# Patient Record
Sex: Female | Born: 1985 | Race: Black or African American | Hispanic: No | Marital: Single | Smoking: Former smoker
Health system: Southern US, Community
[De-identification: ages and names within clinical notes are randomized; demographics above are authoritative.]

## PROBLEM LIST (undated history)

## (undated) DIAGNOSIS — R569 Unspecified convulsions: Secondary | ICD-10-CM

## (undated) DIAGNOSIS — O09299 Supervision of pregnancy with other poor reproductive or obstetric history, unspecified trimester: Secondary | ICD-10-CM

## (undated) DIAGNOSIS — Z973 Presence of spectacles and contact lenses: Secondary | ICD-10-CM

## (undated) DIAGNOSIS — Z309 Encounter for contraceptive management, unspecified: Secondary | ICD-10-CM

## (undated) HISTORY — DX: Unspecified convulsions: R56.9

## (undated) HISTORY — DX: Supervision of pregnancy with other poor reproductive or obstetric history, unspecified trimester: O09.299

## (undated) HISTORY — DX: Presence of spectacles and contact lenses: Z97.3

## (undated) HISTORY — DX: Encounter for contraceptive management, unspecified: Z30.9

---

## 2007-05-25 ENCOUNTER — Emergency Department (HOSPITAL_COMMUNITY): Admission: EM | Admit: 2007-05-25 | Discharge: 2007-05-25 | Payer: Self-pay | Admitting: Emergency Medicine

## 2007-05-29 ENCOUNTER — Inpatient Hospital Stay (HOSPITAL_COMMUNITY): Admission: AD | Admit: 2007-05-29 | Discharge: 2007-05-30 | Payer: Self-pay | Admitting: Gynecology

## 2007-11-22 HISTORY — PX: CERVICAL CERCLAGE: SHX1329

## 2007-12-30 ENCOUNTER — Inpatient Hospital Stay (HOSPITAL_COMMUNITY): Admission: AD | Admit: 2007-12-30 | Discharge: 2007-12-31 | Payer: Self-pay | Admitting: Obstetrics & Gynecology

## 2008-03-26 ENCOUNTER — Ambulatory Visit: Payer: Self-pay | Admitting: Family Medicine

## 2008-03-26 ENCOUNTER — Inpatient Hospital Stay (HOSPITAL_COMMUNITY): Admission: AD | Admit: 2008-03-26 | Discharge: 2008-03-28 | Payer: Self-pay | Admitting: Family Medicine

## 2008-03-29 ENCOUNTER — Inpatient Hospital Stay (HOSPITAL_COMMUNITY): Admission: AD | Admit: 2008-03-29 | Discharge: 2008-04-01 | Payer: Self-pay | Admitting: Obstetrics and Gynecology

## 2008-03-29 ENCOUNTER — Ambulatory Visit: Payer: Self-pay | Admitting: Obstetrics and Gynecology

## 2008-03-30 ENCOUNTER — Encounter: Payer: Self-pay | Admitting: Obstetrics and Gynecology

## 2009-01-12 ENCOUNTER — Emergency Department (HOSPITAL_COMMUNITY): Admission: EM | Admit: 2009-01-12 | Discharge: 2009-01-12 | Payer: Self-pay | Admitting: Emergency Medicine

## 2009-01-15 ENCOUNTER — Emergency Department (HOSPITAL_COMMUNITY): Admission: EM | Admit: 2009-01-15 | Discharge: 2009-01-15 | Payer: Self-pay | Admitting: Emergency Medicine

## 2010-02-08 ENCOUNTER — Inpatient Hospital Stay (HOSPITAL_COMMUNITY): Admission: AD | Admit: 2010-02-08 | Discharge: 2010-02-08 | Payer: Self-pay | Admitting: Obstetrics & Gynecology

## 2010-02-26 ENCOUNTER — Inpatient Hospital Stay (HOSPITAL_COMMUNITY): Admission: AD | Admit: 2010-02-26 | Discharge: 2010-02-26 | Payer: Self-pay | Admitting: Obstetrics and Gynecology

## 2010-03-15 ENCOUNTER — Inpatient Hospital Stay (HOSPITAL_COMMUNITY): Admission: AD | Admit: 2010-03-15 | Discharge: 2010-03-15 | Payer: Self-pay | Admitting: Obstetrics & Gynecology

## 2010-04-29 ENCOUNTER — Encounter: Payer: Self-pay | Admitting: Family Medicine

## 2010-04-29 ENCOUNTER — Inpatient Hospital Stay (HOSPITAL_COMMUNITY): Admission: AD | Admit: 2010-04-29 | Discharge: 2010-05-02 | Payer: Self-pay | Admitting: Obstetrics & Gynecology

## 2010-04-29 ENCOUNTER — Ambulatory Visit: Payer: Self-pay | Admitting: Nurse Practitioner

## 2010-05-01 ENCOUNTER — Encounter: Payer: Self-pay | Admitting: Obstetrics & Gynecology

## 2010-05-19 ENCOUNTER — Ambulatory Visit: Payer: Self-pay | Admitting: Obstetrics and Gynecology

## 2010-05-19 LAB — CONVERTED CEMR LAB
Anticardiolipin IgA: 0 (ref <22)
Anticardiolipin IgG: 5 (ref <23)
Protein S Activity: 85 % (ref 69–129)

## 2010-05-21 DEATH — deceased

## 2010-05-26 ENCOUNTER — Ambulatory Visit (HOSPITAL_COMMUNITY): Admission: RE | Admit: 2010-05-26 | Discharge: 2010-05-26 | Payer: Self-pay | Admitting: Family Medicine

## 2010-06-07 ENCOUNTER — Ambulatory Visit: Payer: Self-pay | Admitting: Obstetrics & Gynecology

## 2011-02-07 LAB — URINALYSIS, ROUTINE W REFLEX MICROSCOPIC
Glucose, UA: NEGATIVE mg/dL
Protein, ur: NEGATIVE mg/dL
Specific Gravity, Urine: 1.01 (ref 1.005–1.030)
Urobilinogen, UA: 0.2 mg/dL (ref 0.0–1.0)

## 2011-02-07 LAB — CBC
HCT: 36 % (ref 36.0–46.0)
HCT: 37.8 % (ref 36.0–46.0)
HCT: 37.9 % (ref 36.0–46.0)
Hemoglobin: 12.4 g/dL (ref 12.0–15.0)
Hemoglobin: 12.7 g/dL (ref 12.0–15.0)
Hemoglobin: 12.9 g/dL (ref 12.0–15.0)
MCHC: 33.5 g/dL (ref 30.0–36.0)
MCHC: 34 g/dL (ref 30.0–36.0)
MCV: 90.3 fL (ref 78.0–100.0)
MCV: 90.5 fL (ref 78.0–100.0)
Platelets: 219 10*3/uL (ref 150–400)
RBC: 3.99 MIL/uL (ref 3.87–5.11)
RBC: 4.19 MIL/uL (ref 3.87–5.11)
RDW: 13.4 % (ref 11.5–15.5)
WBC: 16.5 10*3/uL — ABNORMAL HIGH (ref 4.0–10.5)
WBC: 22.8 10*3/uL — ABNORMAL HIGH (ref 4.0–10.5)

## 2011-02-07 LAB — STREP B DNA PROBE

## 2011-02-07 LAB — URINE MICROSCOPIC-ADD ON

## 2011-02-07 LAB — WET PREP, GENITAL: Trich, Wet Prep: NONE SEEN

## 2011-02-07 LAB — URINE CULTURE: Colony Count: >=100000

## 2011-02-08 LAB — URINALYSIS, ROUTINE W REFLEX MICROSCOPIC
Bilirubin Urine: NEGATIVE
Glucose, UA: NEGATIVE mg/dL
Nitrite: NEGATIVE
Protein, ur: NEGATIVE mg/dL
Specific Gravity, Urine: 1.015 (ref 1.005–1.030)
Urobilinogen, UA: 0.2 mg/dL (ref 0.0–1.0)
pH: 7 (ref 5.0–8.0)

## 2011-02-08 LAB — URINE MICROSCOPIC-ADD ON

## 2011-02-09 LAB — URINALYSIS, ROUTINE W REFLEX MICROSCOPIC: Hgb urine dipstick: NEGATIVE

## 2011-02-09 LAB — WET PREP, GENITAL: Trich, Wet Prep: NONE SEEN

## 2011-02-09 LAB — GC/CHLAMYDIA PROBE AMP, GENITAL: Chlamydia, DNA Probe: NEGATIVE

## 2011-03-08 LAB — URINALYSIS, ROUTINE W REFLEX MICROSCOPIC
Bilirubin Urine: NEGATIVE
Hgb urine dipstick: NEGATIVE
Ketones, ur: >80 mg/dL — AB
Protein, ur: >300 mg/dL — AB
Specific Gravity, Urine: 1.036 — ABNORMAL HIGH (ref 1.005–1.030)
Urobilinogen, UA: 0.2 mg/dL (ref 0.0–1.0)

## 2011-03-08 LAB — COMPREHENSIVE METABOLIC PANEL
ALT: 35 U/L (ref 0–35)
Alkaline Phosphatase: 44 U/L (ref 39–117)
BUN: 10 mg/dL (ref 6–23)
CO2: 22 mEq/L (ref 19–32)
GFR calc non Af Amer: >60 mL/min (ref >60)
Glucose, Bld: 75 mg/dL (ref 70–99)
Potassium: 3.1 mEq/L — ABNORMAL LOW (ref 3.5–5.1)
Sodium: 130 mEq/L — ABNORMAL LOW (ref 135–145)

## 2011-03-08 LAB — DIFFERENTIAL
Basophils Absolute: 0 10*3/uL (ref 0.0–0.1)
Basophils Absolute: 0.1 10*3/uL (ref 0.0–0.1)
Eosinophils Absolute: 0 10*3/uL (ref 0.0–0.7)
Eosinophils Relative: 0 % (ref 0–5)
Lymphocytes Relative: 26 % (ref 12–46)
Lymphs Abs: 1.4 10*3/uL (ref 0.7–4.0)
Monocytes Absolute: 1.1 10*3/uL — ABNORMAL HIGH (ref 0.1–1.0)
Monocytes Relative: 32 % — ABNORMAL HIGH (ref 3–12)
Neutro Abs: 1.1 10*3/uL — ABNORMAL LOW (ref 1.7–7.7)

## 2011-03-08 LAB — BASIC METABOLIC PANEL
BUN: 7 mg/dL (ref 6–23)
CO2: 24 mEq/L (ref 19–32)
GFR calc Af Amer: >60 mL/min (ref >60)
GFR calc non Af Amer: >60 mL/min (ref >60)
Potassium: 3.9 mEq/L (ref 3.5–5.1)
Sodium: 136 mEq/L (ref 135–145)

## 2011-03-08 LAB — LIPASE, BLOOD: Lipase: 18 U/L (ref 11–59)

## 2011-03-08 LAB — CBC
HCT: 43.7 % (ref 36.0–46.0)
HCT: 46.9 % — ABNORMAL HIGH (ref 36.0–46.0)
Hemoglobin: 15.7 g/dL — ABNORMAL HIGH (ref 12.0–15.0)
MCHC: 33.4 g/dL (ref 30.0–36.0)
Platelets: 207 10*3/uL (ref 150–400)
RBC: 5 MIL/uL (ref 3.87–5.11)
RBC: 5.38 MIL/uL — ABNORMAL HIGH (ref 3.87–5.11)
WBC: 2.7 10*3/uL — ABNORMAL LOW (ref 4.0–10.5)

## 2011-03-08 LAB — URINE MICROSCOPIC-ADD ON

## 2011-03-08 LAB — D-DIMER, QUANTITATIVE: D-Dimer, Quant: 0.33 ug/mL-FEU (ref 0.00–0.48)

## 2011-04-05 NOTE — Op Note (Signed)
Katie Douglas, HOOTS              ACCOUNT NO.:  [id number]   MEDICAL RECORD NO.:  [id number]          PATIENT TYPE:  INP   LOCATION:  9149                          FACILITY:  [facility]   PHYSICIAN:  Tanya S. Shawnie Pons, M.D.   DATE OF BIRTH:  Apr 22, 1986   DATE OF PROCEDURE:  DATE OF DISCHARGE:                               OPERATIVE REPORT   PREOPERATIVE DIAGNOSIS:  Incompetent cervix.   POSTOPERATIVE DIAGNOSIS:  Incompetent cervix.   PROCEDURE:  Rescue cerclage McDonald tie with a Mersilene band.   SURGEON:  Shelbie Proctor. Shawnie Pons, MD   ANESTHESIA:  Spinal.   FINDINGS:  Cervix 0.5 to 1 cm opened with bulging membranes at the  external os.   SPECIMENS:  None.   ESTIMATED BLOOD LOSS:  Minimal.   COMPLICATIONS:  None immediately known.   REASON FOR PROCEDURE:  The patient is a 25 year old gravida 2, para 0-0-  1-0, who had a TAB at 10 weeks approximately 1 year.  She reported for a  new OB visit at the [facility] department on the day prior to the procedure  and on P/V was noted to have an open cervical os.  She underwent  sonography and admission on the day prior to procedure with cultures  being obtained.  The patient's cultures were all negative except she did  have a yeast infection and she had a history of a group B strep positive  urine culture in February that was previously treated.  The patient was  treated for yeast and was posted for cerclage.  She was noted to have  few contractions overnight that she was not feeling.   PROCEDURE:  The patient was taken to the OR, where spinal anesthesia was  administered and this was felt to be adequate.  She was prepped and  draped in usual sterile fashion.  A weighted speculum was placed inside  the vagina and Senn retractors were used to visualize the cervix.  The  previously stated membranes were noted at the office.  A Foley catheter  was inserted into the cervix and the balloon inflated with a continuous  advancement of the Foley balloon to  push the membranes back.  This  remained in place while the cerclage was sewn in.  A Mersilene band was  used to up from 12 to approximately 10/10 to 8/8 to 5/5 to 2/2 to 12.  This was tied down over the Foley catheter and then the Foley was  removed.  On exam post procedure, the cervix fell closed and knot was  tied at 12  o'clock and the edge of suture can be felt posteriorly at the time of  the procedure, so this was not then pulled through this actually could  be felt there.  The cervix was cinched tightly closed.  All instrument  and lap counts were correct x2.  The patient was awakened and taken to  recovery room in stable condition.      Shelbie Proctor. Shawnie Pons, M.D.  Electronically Signed     TSP/MEDQ  D:  03/27/2008  T:  03/28/2008  Job:  [id number]

## 2011-04-05 NOTE — Discharge Summary (Signed)
Katie Douglas, Katie Douglas              ACCOUNT NO.:  [id number]   MEDICAL RECORD NO.:  [id number]          PATIENT TYPE:  INP   LOCATION:  9149                          FACILITY:  [facility]   PHYSICIAN:  Tanya S. Shawnie Pons, M.D.   DATE OF BIRTH:  [birth year]   DATE OF ADMISSION:  03/26/2008  DATE OF DISCHARGE:  03/28/2008                               DISCHARGE SUMMARY   FINAL DIAGNOSES:  1. Intrauterine pregnancy at 19-3/7 weeks.  2. Premature dilation versus incompetent cervix.   PROCEDURES:  Bedrest 48 hours of Indocin and cervical cerclage.   PERTINENT LABORATORIES:  Rubella status which was immune, hepatitis B  negative, gonorrhea and chlamydia negative.  Rapid HIV nonreactive.  Urine drug screen positive for marijuana.  RPR nonreactive.  Urinalysis  that shows large leukocytes.  Blood type O positive.  Antibody screen  negative.  Wet prep that shows many WBCs.  Urine culture is pending at  time of dictation.   REASON FOR PROCEDURE:  Please see H&P on the chart.  The patient is a  gravida 2, para 0-0-1-0, who is admitted for premature dilation of the  cervix.  She was seen on the day of admission at the health department  and noted to have membranes at the os and the cervix was approximately  0.5 cm dilated.  She was subsequently staying here.   HOSPITAL COURSE:  The patient was admitted to this hospital after  cultures were obtained in MICU and ultrasound was done.  Ultrasound  failed to definitively show dilated cervix, but membranes were  visualized at the os and the cervix was opened approximately 0.5 cm.  The patient was placed on bedrest and monitored for 24 hours.  She was  noted to not have any signs of infection or contractions, and so she was  taken for cervical cerclage.  She underwent this procedure under spinal  anesthesia without significant difficulty.  Foley catheter had to be  used to move the membranes of the leg so the cerclage could be  performed.  Please see the  operative report dictation for full details.  Postoperatively, the patient was transferred back to the floor.  She was  monitored for 24 hours for contractions and fever of which she had none.  Her activity was allowed to be increased.  She was up to the bathroom  without difficulty.  She was started on Indocin which she will continue  for 72 hours.  Given how stable she was postoperatively, it was felt she  was stable for discharge.   DISCHARGE DISPOSITION:   CONDITION:  The patient was discharged home in good condition.   DISCHARGE MEDICATIONS:  Prenatal vitamins one p.o. daily.  Indocin 50 mg one p.o. q.6 h. x48 hours.   FOLLOW-UP:  Follow-up will be in [facility] on Thursday, Apr 03, 2008.  [name] will call her with this appointment.   ADDITIONAL INSTRUCTIONS:  The patient was instructed to return with  bleeding, pain, signs and symptoms of infection.      Shelbie Proctor. Shawnie Pons, M.D.  Electronically Signed  TSP/MEDQ  D:  03/28/2008  T:  03/28/2008  Job:  [id number]

## 2011-04-08 NOTE — Discharge Summary (Signed)
NAMESHAHED, YEOMAN              ACCOUNT NO.:  [id number]   MEDICAL RECORD NO.:  [id number]          PATIENT TYPE:  INP   LOCATION:                                FACILITY:  [facility]   PHYSICIAN:  Phil D. Okey Dupre, M.D.     DATE OF BIRTH:  04/10/1986   DATE OF ADMISSION:  DATE OF DISCHARGE:                               DISCHARGE SUMMARY   The extremely immature 20-week infant was delivered after the patient  had premature rupture of membranes and developed a chorioamnionitis.  She was given an epidural for pain and was placed on ampicillin and  clindamycin.  The 20-week fetus was delivered with a 1 Apgar and quickly  expired.  The mother was given Cytotec postpartum to facilitate delivery  of the placenta and was delivered spontaneously at 1500 hours.  The  weight of the fetus and the pathological findings were not apparent on  the chart.   DIAGNOSIS:  Preterm delivery of extremely immature infant.      Phil D. Okey Dupre, M.D.  Electronically Signed     PDR/MEDQ  D:  04/20/2008  T:  04/21/2008  Job:  [id number]

## 2011-08-12 LAB — BASIC METABOLIC PANEL
CO2: 24
Calcium: 9.6
Glucose, Bld: 93
Potassium: 3.2 — ABNORMAL LOW
Sodium: 137

## 2011-08-12 LAB — CBC
HCT: 40.2
Hemoglobin: 13.8
MCHC: 34.3
MCV: 88.3
RDW: 12

## 2011-08-12 LAB — URINALYSIS, ROUTINE W REFLEX MICROSCOPIC
Hgb urine dipstick: NEGATIVE
Specific Gravity, Urine: >1.03 — ABNORMAL HIGH
Urobilinogen, UA: 0.2

## 2011-08-12 LAB — URINE MICROSCOPIC-ADD ON

## 2011-08-12 LAB — URINE CULTURE

## 2011-09-06 LAB — URINE MICROSCOPIC-ADD ON

## 2011-09-06 LAB — COMPREHENSIVE METABOLIC PANEL
Alkaline Phosphatase: 47
BUN: 15
Chloride: 94 — ABNORMAL LOW
GFR calc non Af Amer: >60
Glucose, Bld: 85
Potassium: 2.9 — ABNORMAL LOW
Total Bilirubin: 2.3 — ABNORMAL HIGH
Total Protein: 7.1

## 2011-09-06 LAB — WET PREP, GENITAL
Clue Cells Wet Prep HPF POC: NONE SEEN
Trich, Wet Prep: NONE SEEN

## 2011-09-06 LAB — GC/CHLAMYDIA PROBE AMP, GENITAL
Chlamydia, DNA Probe: NEGATIVE
GC Probe Amp, Genital: NEGATIVE

## 2011-09-06 LAB — CBC
HCT: 47.8 — ABNORMAL HIGH
Hemoglobin: 16 — ABNORMAL HIGH
MCHC: 33.6
MCV: 85.6
Platelets: 310
RBC: 5.58 — ABNORMAL HIGH
RDW: 12.7
WBC: 10.1

## 2011-09-06 LAB — URINALYSIS, ROUTINE W REFLEX MICROSCOPIC
Glucose, UA: NEGATIVE
Hgb urine dipstick: NEGATIVE
Ketones, ur: >80 — AB
Nitrite: NEGATIVE
Nitrite: NEGATIVE
Protein, ur: 30 — AB
Specific Gravity, Urine: 1.028
Specific Gravity, Urine: >1.03 — ABNORMAL HIGH
Urobilinogen, UA: 1
Urobilinogen, UA: 1
pH: 6

## 2011-09-06 LAB — PREGNANCY, URINE: Preg Test, Ur: POSITIVE

## 2013-11-21 DIAGNOSIS — R569 Unspecified convulsions: Secondary | ICD-10-CM

## 2013-11-21 HISTORY — DX: Unspecified convulsions: R56.9

## 2016-04-25 ENCOUNTER — Emergency Department (HOSPITAL_COMMUNITY)
Admission: EM | Admit: 2016-04-25 | Discharge: 2016-04-25 | Disposition: A | Discharge status: Home / Self Care | Payer: No Typology Code available for payment source | Attending: Emergency Medicine | Admitting: Emergency Medicine

## 2016-04-25 ENCOUNTER — Encounter (HOSPITAL_COMMUNITY): Payer: Self-pay | Admitting: *Deleted

## 2016-04-25 ENCOUNTER — Emergency Department (HOSPITAL_COMMUNITY): Payer: No Typology Code available for payment source

## 2016-04-25 DIAGNOSIS — S4992XA Unspecified injury of left shoulder and upper arm, initial encounter: Secondary | ICD-10-CM | POA: Insufficient documentation

## 2016-04-25 DIAGNOSIS — Y9389 Activity, other specified: Secondary | ICD-10-CM | POA: Insufficient documentation

## 2016-04-25 DIAGNOSIS — Z87891 Personal history of nicotine dependence: Secondary | ICD-10-CM | POA: Insufficient documentation

## 2016-04-25 DIAGNOSIS — Y998 Other external cause status: Secondary | ICD-10-CM | POA: Insufficient documentation

## 2016-04-25 DIAGNOSIS — S6991XA Unspecified injury of right wrist, hand and finger(s), initial encounter: Secondary | ICD-10-CM | POA: Insufficient documentation

## 2016-04-25 DIAGNOSIS — S8991XA Unspecified injury of right lower leg, initial encounter: Secondary | ICD-10-CM | POA: Insufficient documentation

## 2016-04-25 DIAGNOSIS — S4991XA Unspecified injury of right shoulder and upper arm, initial encounter: Secondary | ICD-10-CM | POA: Insufficient documentation

## 2016-04-25 DIAGNOSIS — S6992XA Unspecified injury of left wrist, hand and finger(s), initial encounter: Secondary | ICD-10-CM | POA: Insufficient documentation

## 2016-04-25 DIAGNOSIS — Z79899 Other long term (current) drug therapy: Secondary | ICD-10-CM | POA: Insufficient documentation

## 2016-04-25 DIAGNOSIS — S199XXA Unspecified injury of neck, initial encounter: Secondary | ICD-10-CM | POA: Insufficient documentation

## 2016-04-25 DIAGNOSIS — S9032XA Contusion of left foot, initial encounter: Secondary | ICD-10-CM | POA: Insufficient documentation

## 2016-04-25 DIAGNOSIS — Y92410 Unspecified street and highway as the place of occurrence of the external cause: Secondary | ICD-10-CM | POA: Insufficient documentation

## 2016-04-25 DIAGNOSIS — T148XXA Other injury of unspecified body region, initial encounter: Secondary | ICD-10-CM

## 2016-04-25 DIAGNOSIS — T148 Other injury of unspecified body region: Secondary | ICD-10-CM | POA: Insufficient documentation

## 2016-04-25 MED ORDER — NAPROXEN 250 MG PO TABS
500.0000 mg | ORAL_TABLET | Freq: Once | ORAL | Status: AC
  Administered 2016-04-25: 500 mg via ORAL
  Filled 2016-04-25: qty 2

## 2016-04-25 MED ORDER — METHOCARBAMOL 500 MG PO TABS: 1000.0000 mg | ORAL_TABLET | Freq: Four times a day (QID) | ORAL | Status: DC

## 2016-04-25 MED ORDER — NAPROXEN 500 MG PO TABS: 500.0000 mg | ORAL_TABLET | Freq: Two times a day (BID) | ORAL | Status: DC
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
## 2016-04-25 NOTE — ED Notes (Signed)
Declined W/C at D/C and was escorted to lobby by RN. 
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
## 2016-08-26 ENCOUNTER — Emergency Department (HOSPITAL_COMMUNITY)
Admission: EM | Admit: 2016-08-26 | Discharge: 2016-08-27 | Disposition: A | Discharge status: Home / Self Care | Payer: BLUE CROSS/BLUE SHIELD | Attending: Emergency Medicine | Admitting: Emergency Medicine

## 2016-08-26 ENCOUNTER — Encounter (HOSPITAL_COMMUNITY): Payer: Self-pay | Admitting: Nurse Practitioner

## 2016-08-26 DIAGNOSIS — K148 Other diseases of tongue: Secondary | ICD-10-CM | POA: Diagnosis not present

## 2016-08-26 DIAGNOSIS — Z87891 Personal history of nicotine dependence: Secondary | ICD-10-CM | POA: Insufficient documentation

## 2016-08-26 DIAGNOSIS — R569 Unspecified convulsions: Secondary | ICD-10-CM | POA: Diagnosis present

## 2016-08-26 DIAGNOSIS — G40909 Epilepsy, unspecified, not intractable, without status epilepticus: Secondary | ICD-10-CM | POA: Diagnosis not present

## 2016-08-26 DIAGNOSIS — F1290 Cannabis use, unspecified, uncomplicated: Secondary | ICD-10-CM | POA: Diagnosis not present

## 2016-08-26 DIAGNOSIS — Z79899 Other long term (current) drug therapy: Secondary | ICD-10-CM | POA: Insufficient documentation

## 2016-08-26 NOTE — ED Notes (Signed)
Bed: WA21 Expected date:  Expected time:  Means of arrival:  Comments: 30 yr old seizure

## 2016-08-26 NOTE — ED Triage Notes (Signed)
Pt is presented from home for evaluation of a reportedly witnessed seizure lasting 3 mins, not taking Kepra for the last 4 months, c/o fatigue, AOx4, was tachycardic with EMS HR 140-150. Endorses etoh consumption tonight.

## 2016-08-26 NOTE — ED Notes (Signed)
Pt also reports smoking marijuana tonight.

## 2016-08-26 NOTE — ED Provider Notes (Signed)
[facility]-EMERGENCY DEPT Provider Note: Katie Dell, MD, FACEP  CSN: [id number] MRN: [id number] ARRIVAL: 08/26/16 at 2304  By signing my name below, I, Katie Douglas, attest that this documentation has been prepared under the direction and in the presence of Katie Libra, MD. Electronically Signed: Bridgette Douglas, ED Scribe. 08/27/16. 12:03 AM   CHIEF COMPLAINT  Seizure   HISTORY OF PRESENT ILLNESS  HPI Comments: Katie Douglas is a 30 y.o. female who presents to the Emergency Department with a witnessed seizure a seizure onset late yesterday evening lasting 6-7 minutes. It was characterized as hypertonic not tonic-clonic. Pt reports she's been off her prescribed Kepra for the last 4 months because she "hasn't been having seizures". She was not incontinent but did bite her tongue. She notes she has smoked marijuana and drank alcohol tonight. Pt denies fever. She is in no pain at this time.   Past Medical History:  Diagnosis Date  . Seizures (HCC)     Past Surgical History:  Procedure Laterality Date  . CESAREAN SECTION N/A 2013    History reviewed. No pertinent family history.  Social History  Substance Use Topics  . Smoking status: Former Smoker    Quit date: 04/25/2012  . Smokeless tobacco: Never Used  . Alcohol use No    Prior to Admission medications   Medication Sig Start Date End Date Taking? Authorizing Provider  Multiple Vitamin (MULTIVITAMIN WITH MINERALS) TABS tablet Take 1 tablet by mouth daily.   Yes Historical Provider, MD  levETIRAcetam (KEPPRA) 500 MG tablet Take 1 tablet (500 mg total) by mouth 2 (two) times daily. 08/27/16   Katie Libra, MD    Allergies Review of patient's allergies indicates no known allergies.   REVIEW OF SYSTEMS  Negative except as noted here or in the History of Present Illness.   PHYSICAL EXAMINATION  Initial Vital Signs Blood pressure 124/76, pulse (!) 126, temperature 98.4 F (36.9 C), temperature source Oral, resp. rate 20, SpO2 94  %.  Examination General: Well-developed, well-nourished female in no acute distress; appearance consistent with age of record HENT: normocephalic; bite mark to right side of tongue  Eyes: pupils equal, round and reactive to light; extraocular muscles intact Neck: supple Heart: regular rate and rhythm; tachycardia Lungs: clear to auscultation bilaterally Abdomen: soft; nondistended; nontender; no masses or hepatosplenomegaly; bowel sounds present Extremities: No deformity; full range of motion; pulses normal Neurologic: Awake, alert and oriented; motor function intact in all extremities and symmetric; no facial droop Skin: Warm and dry Psychiatric: Normal mood and affect   RESULTS  Summary of this visit's results, reviewed by myself:   EKG Interpretation  Date/Time:  Friday August 26 2016 23:14:32 EDT Ventricular Rate:  123 PR Interval:    QRS Duration: 87 QT Interval:  293 QTC Calculation: 420 R Axis:   60 Text Interpretation:  Sinus tachycardia Nonspecific repol abnormality, diffuse leads No previous ECGs available Confirmed by LITTLE MD, RACHEL ([id number]) on 08/26/2016 11:26:37 PM      Laboratory Studies: No results found for this or any previous visit (from the past 24 hour(s)). Imaging Studies: No results found.  ED COURSE  Nursing notes and initial vitals signs, including pulse oximetry, reviewed.  Vitals:   08/26/16 2314  BP: 124/76  Pulse: (!) 126  Resp: 20  Temp: 98.4 F (36.9 C)  TempSrc: Oral  SpO2: 94%    PROCEDURES    ED DIAGNOSES     ICD-9-CM ICD-10-CM   1. Seizure (HCC) 780.39 R56.9  2. Tongue biting 529.8 K14.8     I personally performed the services described in this documentation, which was scribed in my presence. The recorded information has been reviewed and is accurate.      Katie LibraJohn Emmanuel Ercole, MD 08/27/16 [phone]

## 2016-08-27 MED ORDER — LEVETIRACETAM 500 MG PO TABS
500.0000 mg | ORAL_TABLET | Freq: Two times a day (BID) | ORAL | 0 refills | Status: DC
Start: 2016-08-27 — End: 2016-10-18

## 2016-08-27 MED ORDER — SODIUM CHLORIDE 0.9 % IV SOLN
1000.0000 mg | Freq: Once | INTRAVENOUS | Status: AC
  Administered 2016-08-27: 1000 mg via INTRAVENOUS
  Filled 2016-08-27: qty 10

## 2016-10-10 ENCOUNTER — Emergency Department (HOSPITAL_COMMUNITY): Payer: BLUE CROSS/BLUE SHIELD

## 2016-10-10 ENCOUNTER — Encounter (HOSPITAL_COMMUNITY): Payer: Self-pay

## 2016-10-10 ENCOUNTER — Observation Stay (HOSPITAL_COMMUNITY)
Admission: EM | Admit: 2016-10-10 | Discharge: 2016-10-12 | Disposition: A | Discharge status: Home / Self Care | Payer: BLUE CROSS/BLUE SHIELD | Attending: Internal Medicine | Admitting: Internal Medicine

## 2016-10-10 DIAGNOSIS — G40909 Epilepsy, unspecified, not intractable, without status epilepticus: Secondary | ICD-10-CM | POA: Insufficient documentation

## 2016-10-10 DIAGNOSIS — J341 Cyst and mucocele of nose and nasal sinus: Secondary | ICD-10-CM | POA: Diagnosis not present

## 2016-10-10 DIAGNOSIS — R42 Dizziness and giddiness: Secondary | ICD-10-CM | POA: Diagnosis not present

## 2016-10-10 DIAGNOSIS — R112 Nausea with vomiting, unspecified: Secondary | ICD-10-CM | POA: Insufficient documentation

## 2016-10-10 DIAGNOSIS — Z87891 Personal history of nicotine dependence: Secondary | ICD-10-CM | POA: Diagnosis not present

## 2016-10-10 DIAGNOSIS — R569 Unspecified convulsions: Secondary | ICD-10-CM

## 2016-10-10 DIAGNOSIS — H81399 Other peripheral vertigo, unspecified ear: Secondary | ICD-10-CM | POA: Diagnosis not present

## 2016-10-10 LAB — CBC WITH DIFFERENTIAL/PLATELET
BASOS PCT: 0 %
Basophils Absolute: 0 10*3/uL (ref 0.0–0.1)
EOS ABS: 0 10*3/uL (ref 0.0–0.7)
EOS PCT: 0 %
HEMATOCRIT: 42.8 % (ref 36.0–46.0)
Hemoglobin: 14 g/dL (ref 12.0–15.0)
Lymphocytes Relative: 17 %
Lymphs Abs: 1.6 10*3/uL (ref 0.7–4.0)
MCH: 28 pg (ref 26.0–34.0)
MCHC: 32.7 g/dL (ref 30.0–36.0)
MCV: 85.6 fL (ref 78.0–100.0)
MONO ABS: 0.5 10*3/uL (ref 0.1–1.0)
MONOS PCT: 5 %
Neutro Abs: 7.4 10*3/uL (ref 1.7–7.7)
Neutrophils Relative %: 78 %
PLATELETS: 264 10*3/uL (ref 150–400)
RBC: 5 MIL/uL (ref 3.87–5.11)
RDW: 12.3 % (ref 11.5–15.5)
WBC: 9.5 10*3/uL (ref 4.0–10.5)

## 2016-10-10 LAB — BASIC METABOLIC PANEL
Anion gap: 13 (ref 5–15)
BUN: 11 mg/dL (ref 6–20)
CALCIUM: 9.6 mg/dL (ref 8.9–10.3)
CO2: 20 mmol/L — AB (ref 22–32)
CREATININE: 0.98 mg/dL (ref 0.44–1.00)
Chloride: 106 mmol/L (ref 101–111)
GFR calc Af Amer: >60 mL/min (ref >60)
GLUCOSE: 119 mg/dL — AB (ref 65–99)
Potassium: 3.6 mmol/L (ref 3.5–5.1)
Sodium: 139 mmol/L (ref 135–145)

## 2016-10-10 LAB — I-STAT BETA HCG BLOOD, ED (MC, WL, AP ONLY)

## 2016-10-10 MED ORDER — MAGNESIUM CITRATE PO SOLN: 1.0000 | Freq: Once | ORAL | Status: DC | PRN

## 2016-10-10 MED ORDER — SENNOSIDES-DOCUSATE SODIUM 8.6-50 MG PO TABS: 1.0000 | ORAL_TABLET | Freq: Every evening | ORAL | Status: DC | PRN

## 2016-10-10 MED ORDER — ACETAMINOPHEN 325 MG PO TABS: 650.0000 mg | ORAL_TABLET | Freq: Four times a day (QID) | ORAL | Status: DC | PRN

## 2016-10-10 MED ORDER — ONDANSETRON HCL 4 MG/2ML IJ SOLN: 4.0000 mg | Freq: Three times a day (TID) | INTRAMUSCULAR | Status: DC | PRN

## 2016-10-10 MED ORDER — PROMETHAZINE HCL 25 MG/ML IJ SOLN: 25.0000 mg | Freq: Four times a day (QID) | INTRAMUSCULAR | Status: DC | PRN

## 2016-10-10 MED ORDER — BISACODYL 10 MG RE SUPP: 10.0000 mg | Freq: Every day | RECTAL | Status: DC | PRN

## 2016-10-10 MED ORDER — MECLIZINE HCL 25 MG PO TABS
25.0000 mg | ORAL_TABLET | Freq: Once | ORAL | Status: AC
  Administered 2016-10-10: 25 mg via ORAL
  Filled 2016-10-10: qty 1

## 2016-10-10 MED ORDER — CLONAZEPAM 1 MG PO TBDP: 1.0000 mg | ORAL_TABLET | Freq: Two times a day (BID) | ORAL | Status: DC

## 2016-10-10 MED ORDER — ONDANSETRON HCL 4 MG/2ML IJ SOLN
4.0000 mg | Freq: Once | INTRAMUSCULAR | Status: AC
  Administered 2016-10-10: 4 mg via INTRAVENOUS
  Filled 2016-10-10: qty 2

## 2016-10-10 MED ORDER — SODIUM CHLORIDE 0.9 % IV BOLUS (SEPSIS)
1000.0000 mL | Freq: Once | INTRAVENOUS | Status: AC
  Administered 2016-10-10: 1000 mL via INTRAVENOUS

## 2016-10-10 MED ORDER — HYDROCODONE-ACETAMINOPHEN 5-325 MG PO TABS: 1.0000 | ORAL_TABLET | ORAL | Status: DC | PRN

## 2016-10-10 MED ORDER — DIAZEPAM 5 MG/ML IJ SOLN
5.0000 mg | Freq: Once | INTRAMUSCULAR | Status: AC
  Administered 2016-10-10: 5 mg via INTRAVENOUS
  Filled 2016-10-10: qty 2

## 2016-10-10 MED ORDER — ACETAMINOPHEN 650 MG RE SUPP: 650.0000 mg | Freq: Four times a day (QID) | RECTAL | Status: DC | PRN

## 2016-10-10 MED ORDER — PROMETHAZINE HCL 25 MG/ML IJ SOLN
25.0000 mg | Freq: Once | INTRAMUSCULAR | Status: AC
Start: 2016-10-10 — End: 2016-10-10
  Administered 2016-10-10: 25 mg via INTRAVENOUS
  Filled 2016-10-10: qty 1

## 2016-10-10 MED ORDER — IOPAMIDOL (ISOVUE-370) INJECTION 76%
INTRAVENOUS | Status: AC
  Administered 2016-10-10: 50 mL
  Filled 2016-10-10: qty 50

## 2016-10-10 MED ORDER — ENOXAPARIN SODIUM 40 MG/0.4ML ~~LOC~~ SOLN
40.0000 mg | SUBCUTANEOUS | Status: DC
  Administered 2016-10-10 – 2016-10-11 (×2): 40 mg via SUBCUTANEOUS
  Filled 2016-10-10 (×2): qty 0.4

## 2016-10-10 MED ORDER — CLONAZEPAM 1 MG PO TABS
1.0000 mg | ORAL_TABLET | Freq: Two times a day (BID) | ORAL | Status: DC
  Administered 2016-10-10 – 2016-10-12 (×4): 1 mg via ORAL
  Filled 2016-10-10 (×4): qty 1

## 2016-10-10 MED ORDER — SODIUM CHLORIDE 0.9% FLUSH
3.0000 mL | Freq: Two times a day (BID) | INTRAVENOUS | Status: DC
  Administered 2016-10-10 – 2016-10-11 (×2): 3 mL via INTRAVENOUS

## 2016-10-10 MED ORDER — MECLIZINE HCL 12.5 MG PO TABS
25.0000 mg | ORAL_TABLET | Freq: Two times a day (BID) | ORAL | Status: DC
  Administered 2016-10-10 – 2016-10-11 (×2): 25 mg via ORAL
  Filled 2016-10-10 (×2): qty 2

## 2016-10-10 NOTE — ED Notes (Signed)
Patient transported to MRI 

## 2016-10-10 NOTE — H&P (Signed)
History and Physical    Katie Douglas:[id number] DOB: 1986-05-17 DOA: 10/10/2016   PCP: No PCP Per Patient   Patient coming from:  Home   Chief Complaint: Vertigo   HPI: Katie Douglas is a 30 y.o. female with medical history significant for seizures on chronic Keppra 500 mg BID, presenting with acute onset of vertigo since putting out her new prescription. She reports that this method was not the same generic brand that she usually uses. On Saturday morning she noted to be significantly DC, felt off balance, and had nausea and vomiting. She attempted to CAD her dose downing by half and felt that these symptoms worsen as time went on which brought her to the hospital today. She reports being very off-balance, DC, but her workup is essentially negative. MRI of the brain and CT angiography head is our negative for abnormalities. She denies any neck trauma neck pain or accidental fall. Denies fevers, chills, night sweats, vision changes, or mucositis. Denies any respiratory complaints. Denies any chest pain or palpitations. Denies lower extremity swelling. Denies nausea, heartburn or change in bowel habits. Denies abdominal pain. Appetite is normal. Denies any dysuria. Denies abnormal skin rashes, or neuropathy. Denies any bleeding issues such as epistaxis, hematemesis, hematuria or hematochezia.     ED Course:  BP 116/94 (BP Location: Right Arm) Comment: Simultaneous filing. User may not have seen previous data.  Pulse 81 Comment: Simultaneous filing. User may not have seen previous data.  Temp 98.1 F (36.7 C) (Oral)   Resp 16   Ht 5\' 2"  (1.575 m)   Wt 81.6 kg (180 lb)   LMP 09/27/2016 (Exact Date)   SpO2 100% Comment: Simultaneous filing. User may not have seen previous data.  BMI 32.92 kg/m      Review of Systems: As per HPI otherwise 10 point review of systems negative.   Past Medical History:  Diagnosis Date  . Seizures (HCC)     Past Surgical History:  Procedure  Laterality Date  . CESAREAN SECTION N/A 2013    Social History Social History   Social History  . Marital status: Single    Spouse name: N/A  . Number of children: N/A  . Years of education: N/A   Occupational History  . Not on file.   Social History Main Topics  . Smoking status: Former Smoker    Quit date: 04/25/2012  . Smokeless tobacco: Never Used  . Alcohol use No  . Drug use:     Types: Marijuana  . Sexual activity: Not on file   Other Topics Concern  . Not on file   Social History Narrative  . No narrative on file     No Known Allergies  No family history on file.    Prior to Admission medications   Medication Sig Start Date End Date Taking? Authorizing Provider  levETIRAcetam (KEPPRA) 500 MG tablet Take 1 tablet (500 mg total) by mouth 2 (two) times daily. 08/27/16  Yes John Molpus, MD  Multiple Vitamin (MULTIVITAMIN WITH MINERALS) TABS tablet Take 1 tablet by mouth daily.    Historical Provider, MD    Physical Exam:    Vitals:   10/10/16 1030 10/10/16 1045 10/10/16 1100 10/10/16 1330  BP: 107/72 109/71 100/87 116/94  Pulse: (!) 58 86 61 81  Resp: 20 17 (!) 8 16  Temp:      TempSrc:      SpO2: 100% 100% 100% 100%  Weight:  Height:           Constitutional: NAD, calm, comfortable  Vitals:   10/10/16 1030 10/10/16 1045 10/10/16 1100 10/10/16 1330  BP: 107/72 109/71 100/87 116/94  Pulse: (!) 58 86 61 81  Resp: 20 17 (!) 8 16  Temp:      TempSrc:      SpO2: 100% 100% 100% 100%  Weight:      Height:       Eyes: PERRL, lids and conjunctivae normal ENMT: Mucous membranes are moist. Posterior pharynx clear of any exudate or lesions.Normal dentition.  Neck: normal, supple, no masses, no thyromegaly Respiratory: clear to auscultation bilaterally, no wheezing, no crackles. Normal respiratory effort. No accessory muscle use.  Cardiovascular: Regular rate and rhythm, no murmurs / rubs / gallops. No extremity edema. 2+ pedal pulses. No carotid  bruits.  Abdomen: no tenderness, no masses palpated. No hepatosplenomegaly. Bowel sounds positive.  Musculoskeletal: no clubbing / cyanosis. No joint deformity upper and lower extremities. Good ROM, no contractures. Normal muscle tone.  Skin: no rashes, lesions, ulcers.  Neurologic: CN 2-12 grossly intact. Sensation intact, DTR normal. Strength 5/5 in all 4.  Psychiatric: Normal judgment and insight. Alert and oriented x 3. Normal mood.     Labs on Admission: I have personally reviewed following labs and imaging studies  CBC:  Recent Labs Lab 10/10/16 0934  WBC 9.5  NEUTROABS 7.4  HGB 14.0  HCT 42.8  MCV 85.6  PLT 264    Basic Metabolic Panel:  Recent Labs Lab 10/10/16 0934  NA 139  K 3.6  CL 106  CO2 20*  GLUCOSE 119*  BUN 11  CREATININE 0.98  CALCIUM 9.6    GFR: Estimated Creatinine Clearance: 82.3 mL/min (by C-G formula based on SCr of 0.98 mg/dL).  Liver Function Tests: No results for input(s): AST, ALT, ALKPHOS, BILITOT, PROT, ALBUMIN in the last 168 hours. No results for input(s): LIPASE, AMYLASE in the last 168 hours. No results for input(s): AMMONIA in the last 168 hours.  Coagulation Profile: No results for input(s): INR, PROTIME in the last 168 hours.  Cardiac Enzymes: No results for input(s): CKTOTAL, CKMB, CKMBINDEX, TROPONINI in the last 168 hours.  BNP (last 3 results) No results for input(s): PROBNP in the last 8760 hours.  HbA1C: No results for input(s): HGBA1C in the last 72 hours.  CBG: No results for input(s): GLUCAP in the last 168 hours.  Lipid Profile: No results for input(s): CHOL, HDL, LDLCALC, TRIG, CHOLHDL, LDLDIRECT in the last 72 hours.  Thyroid Function Tests: No results for input(s): TSH, T4TOTAL, FREET4, T3FREE, THYROIDAB in the last 72 hours.  Anemia Panel: No results for input(s): VITAMINB12, FOLATE, FERRITIN, TIBC, IRON, RETICCTPCT in the last 72 hours.  Urine analysis:    Component Value Date/Time    COLORURINE YELLOW 04/29/2010 2242   APPEARANCEUR CLEAR 04/29/2010 2242   LABSPEC 1.010 04/29/2010 2242   PHURINE 7.5 04/29/2010 2242   GLUCOSEU NEGATIVE 04/29/2010 2242   HGBUR TRACE (A) 04/29/2010 2242   BILIRUBINUR NEGATIVE 04/29/2010 2242   KETONESUR NEGATIVE 04/29/2010 2242   PROTEINUR NEGATIVE 04/29/2010 2242   UROBILINOGEN 0.2 04/29/2010 2242   NITRITE NEGATIVE 04/29/2010 2242   LEUKOCYTESUR SMALL (A) 04/29/2010 2242    Sepsis Labs: @LABRCNTIP (procalcitonin:4,lacticidven:4) )No results found for this or any previous visit (from the past 240 hour(s)).   Radiological Exams on Admission: Ct Angio Head W Or Wo Contrast  Result Date: 10/10/2016 CLINICAL DATA:  Vertigo. EXAM: CT ANGIOGRAPHY HEAD AND  NECK TECHNIQUE: Multidetector CT imaging of the head and neck was performed using the standard protocol during bolus administration of intravenous contrast. Multiplanar CT image reconstructions and MIPs were obtained to evaluate the vascular anatomy. Carotid stenosis measurements (when applicable) are obtained utilizing NASCET criteria, using the distal internal carotid diameter as the denominator. CONTRAST:  50 cc Isovue 370 intravenous COMPARISON:  None. FINDINGS: CT HEAD FINDINGS Brain: Normal. No evidence of acute infarction, hemorrhage, hydrocephalus, extra-axial collection or mass lesion/mass effect. Vascular: Described below Skull: No fracture or destructive lesion. Sinuses:   Mucous retention cysts in the right maxillary antrum. Orbits: Gaze to the right thumb also seen on the previous brain MRI. Review of the MIP images confirms the above findings CTA NECK FINDINGS Aortic arch: Normal.  Normal.  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 No atheromatous changes, stenosis, dissection, or beading. Left carotid system: No atheromatous changes, stenosis, dissection, or beading. Vertebral arteries:  Strong left vertebral artery dominance. No dissection or stenosis. Skeleton: No acute or aggressive finding. Other neck: Negative for adenopathy or mass. Upper chest: Negative Review of the MIP images confirms the above findings CTA HEAD FINDINGS Anterior circulation: Other than aplastic right A1 segment the circle-of-Willis is intact. No stenosis, beading, major branch occlusion, or aneurysm. Posterior circulation: Non dominant right vertebral artery ends in PICA. No stenosis, beading, or major branch occlusion. Negative for aneurysm. Venous sinuses: Patent Anatomic variants: None unusual Delayed phase: Review of the MIP images confirms the above findings IMPRESSION: Negative CTA of the head and neck. 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 Electronically Signed   By: Marnee SpringJonathon  Watts M.D.   On: 10/10/2016 15:35   Ct Angio Neck W Or Wo Contrast  Result Date: 10/10/2016 CLINICAL DATA:  Vertigo. EXAM: CT ANGIOGRAPHY HEAD AND NECK TECHNIQUE: Multidetector CT imaging of the head and neck was performed using the standard protocol during bolus administration of intravenous contrast. Multiplanar CT image reconstructions and MIPs were obtained to evaluate the vascular anatomy. Carotid stenosis measurements (when applicable) are obtained utilizing NASCET criteria, using the distal internal carotid diameter as the denominator. CONTRAST:  50 cc Isovue 370 intravenous COMPARISON:  None. FINDINGS: CT HEAD FINDINGS Brain: Normal. No evidence of acute infarction, hemorrhage, hydrocephalus, extra-axial collection or mass lesion/mass effect. Vascular: Described below Skull: No fracture or destructive lesion. Sinuses:   Mucous retention cysts in the right maxillary antrum. Orbits: Gaze to the right thumb also seen on the previous brain MRI. Review of the MIP images confirms the above findings CTA NECK FINDINGS Aortic arch: Normal.  Normal.  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 No atheromatous changes, stenosis, dissection, or beading. Left carotid system: No atheromatous changes, stenosis, dissection, or beading. Vertebral arteries: Strong left vertebral artery dominance. No dissection or stenosis. Skeleton: No acute or aggressive finding. Other neck: Negative for adenopathy or mass. Upper chest: Negative Review of the MIP images confirms the above findings CTA HEAD FINDINGS Anterior circulation: Other than aplastic right A1 segment the circle-of-Willis is intact. No stenosis, beading, major branch occlusion, or aneurysm. Posterior circulation: Non dominant right vertebral artery ends in PICA. No stenosis, beading, or major branch occlusion. Negative for aneurysm. Venous sinuses: Patent Anatomic variants: None unusual Delayed phase: Review of the MIP images confirms the above findings IMPRESSION: Negative CTA of  the head and neck. 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 Electronically Signed   By: Marnee SpringJonathon  Watts M.D.   On: 10/10/2016 15:35   Mr Brain Wo Contrast  Result Date: 10/10/2016 CLINICAL DATA:  30 year old female with sudden onset of nausea and dizziness. Possible medication reaction. On Keppra for seizure for the past year. Initial encounter. EXAM: MRI HEAD WITHOUT CONTRAST TECHNIQUE: Multiplanar, multiecho pulse sequences of the brain and surrounding structures were obtained without intravenous contrast. COMPARISON:  None. FINDINGS: Brain: No acute infarct or intracranial hemorrhage. No atrophy or hydrocephalus. No intracranial mass lesion noted on this unenhanced exam. Partially empty minimally prominent size sella without secondary findings of pseudotumor. High-resolution imaging not performed through the hippocampus however, no evidence of mesial temporal sclerosis. Vascular: Right vertebral artery is diminutive in size possibly  ending in a posterior inferior cerebellar artery distribution. Remainder major intracranial vascular structures are patent. Skull and upper cervical spine: Negative. Sinuses/Orbits: No acute orbital abnormality. Inferior right maxillary sinus 1.2 cm structure may represent a retention cyst. Other: Top-normal size upper neck lymph nodes of indeterminate significance. IMPRESSION: No acute infarct or intracranial hemorrhage. No intracranial mass lesion noted on this unenhanced exam. 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 However if the patient had right-sided neck pain raising possibility of right vertebral artery dissection than CT angiogram or MR angiogram could be obtained for further delineation. Electronically Signed   By: Lacy DuverneySteven  Olson M.D.   On: 10/10/2016 12:23    EKG: Independently reviewed.  Assessment/Plan Active Problems:   Vertigo   Seizures (HCC)  Vertigo,: Etiology unclear, suspect  medication induced as these symptoms began once changing her Keppra dose with almost inmediate presence of symptoms   Patient is nauseous, weak, and was unable to stand due to gait instability. MRI MRA without evidence of acute stroke or other significant abnormality. UA small leukocytes.  EKG sith rate 56 bpm,. No syncope WBC and CMET notrmal  - Telemetry, observation - Meclizine when necessary dizziness Klonopin 1 mg bid x 10 days as directed by Neuro  - IVF  - Neurology consult is ongoing  - orthostatics U culture  Repeat EKG in am   History of seizures on Keppra Hold meds and observe if above symptoms subside F/u OP Neuro    DVT prophylaxis: Lovenox   Code Status:   Full    Family Communication:  Discussed with patient Disposition Plan: Expect patient to be discharged to home after condition improves Consults called:     None Admission status:Tele  Obs     Arianis Bowditch E, PA-C [facility]   10/10/2016, 5:10 PM

## 2016-10-10 NOTE — Consult Note (Addendum)
NEURO HOSPITALIST CONSULT NOTE   Requestig physician: Dr. Gerre Pebbles   Reason for Consult: vertigo   History obtained from:  Patient    HPI:                                                                                                                                          Katie Douglas is an 30 y.o. female with known seizures. Patient is on Keppra 500 mg twice a day. Patient states that she has never had side effects on Keppra however this past Friday she picked up a prescription. Continue prescription was not of the same generic brand or brand that she usually uses. Upon Saturday morning she noted that she is significantly dizzy, felt off balance, and had nausea vomiting. Patient attempted to cut her dose down by half and felt that these symptoms significant worsened as time went on which brought her to the hospital today. Currently she states that she feels very off balance and dizzy but not vertiginous. MRI of the brain was obtained showing no intracranial modalities however he did show possible right vertebral dissection. Patient denies any neck trauma, neck pain, or accidental fall. Currently patient is very labile and crying stating "she just wants this to be over"  Past Medical History:  Diagnosis Date  . Seizures (HCC)     Past Surgical History:  Procedure Laterality Date  . CESAREAN SECTION N/A 2013    No family history on file.    Social History:  reports that she quit smoking about 4 years ago. She has never used smokeless tobacco. She reports that she uses drugs, including Marijuana. She reports that she does not drink alcohol.  No Known Allergies  MEDICATIONS:                                                                                                                     No current facility-administered medications for this encounter.    Current Outpatient Prescriptions  Medication Sig Dispense Refill  . levETIRAcetam (KEPPRA) 500 MG  tablet Take 1 tablet (500 mg total) by mouth 2 (two) times daily. 60 tablet 0  . Multiple Vitamin (MULTIVITAMIN WITH MINERALS) TABS tablet Take 1 tablet by mouth daily.        ROS:  History obtained from the patient  General ROS: negative for - chills, fatigue, fever, night sweats, weight gain or weight loss Psychological ROS: negative for - behavioral disorder, hallucinations, memory difficulties, mood swings or suicidal ideation Ophthalmic ROS: negative for - blurry vision, double vision, eye pain or loss of vision ENT ROS: negative for - epistaxis, nasal discharge, oral lesions, sore throat, tinnitus or vertigo Allergy and Immunology ROS: negative for - hives or itchy/watery eyes Hematological and Lymphatic ROS: negative for - bleeding problems, bruising or swollen lymph nodes Endocrine ROS: negative for - galactorrhea, hair pattern changes, polydipsia/polyuria or temperature intolerance Respiratory ROS: negative for - cough, hemoptysis, shortness of breath or wheezing Cardiovascular ROS: negative for - chest pain, dyspnea on exertion, edema or irregular heartbeat Gastrointestinal ROS: negative for - abdominal pain, diarrhea, hematemesis, nausea/vomiting or stool incontinence Genito-Urinary ROS: negative for - dysuria, hematuria, incontinence or urinary frequency/urgency Musculoskeletal ROS: negative for - joint swelling or muscular weakness Neurological ROS: as noted in HPI Dermatological ROS: negative for rash and skin lesion changes   Blood pressure 116/94, pulse 81, temperature 98.1 F (36.7 C), temperature source Oral, resp. rate 16, height 5\' 2"  (1.575 m), weight 81.6 kg (180 lb), last menstrual period 09/27/2016, SpO2 100 %.   Neurologic Examination:                                                                                                       HEENT-  Normocephalic, no lesions, without obvious abnormality.  Normal external eye and conjunctiva.  Normal TM's bilaterally.  Normal auditory canals and external ears. Normal external nose, mucus membranes and septum.  Normal pharynx. Cardiovascular- S1, S2 normal, pulses palpable throughout   Lungs- chest clear, no wheezing, rales, normal symmetric air entry Abdomen- normal findings: bowel sounds normal Extremities- no edema Lymph-no adenopathy palpable Musculoskeletal-no joint tenderness, deformity or swelling Skin-warm and dry, no hyperpigmentation, vitiligo, or suspicious lesions  Neurological Examination Mental Status: Alert, oriented, thought content appropriate.  Speech fluent without evidence of aphasia.  Able to follow 3 step commands without difficulty. Cranial Nerves: II:  Visual fields grossly normal, pupils equal, round, reactive to light and accommodation III,IV, VI: ptosis not present, extra-ocular motions intact bilaterally--patient has wears on nystagmus at far gaze however when she was looking to the left and started looking toward midline nystagmus remained.. Negative Dix-Hallpike.  V,VII: smile symmetric, facial light touch sensation normal bilaterally VIII: hearing normal bilaterally IX,X: uvula rises symmetrically XI: bilateral shoulder shrug XII: midline tongue extension Motor: Right : Upper extremity   5/5    Left:     Upper extremity   5/5  Lower extremity   5/5     Lower extremity   5/5 Tone and bulk:normal tone throughout; no atrophy noted Sensory: Pinprick and light touch intact throughout, bilaterally Deep Tendon Reflexes: 2+ and symmetric throughout Plantars: Right: downgoing   Left: downgoing Cerebellar: normal finger-to-nose,and normal heel-to-shin test Gait: Not tested      Lab Results: Basic Metabolic Panel:  Recent Labs Lab 10/10/16 0934  NA 139  K 3.6  CL 106  CO2 20*  GLUCOSE 119*  BUN 11  CREATININE 0.98  CALCIUM 9.6     Liver Function Tests: No results for input(s): AST, ALT, ALKPHOS, BILITOT, PROT, ALBUMIN in the last 168 hours. No results for input(s): LIPASE, AMYLASE in the last 168 hours. No results for input(s): AMMONIA in the last 168 hours.  CBC:  Recent Labs Lab 10/10/16 0934  WBC 9.5  NEUTROABS 7.4  HGB 14.0  HCT 42.8  MCV 85.6  PLT 264    Cardiac Enzymes: No results for input(s): CKTOTAL, CKMB, CKMBINDEX, TROPONINI in the last 168 hours.  Lipid Panel: No results for input(s): CHOL, TRIG, HDL, CHOLHDL, VLDL, LDLCALC in the last 168 hours.  CBG: No results for input(s): GLUCAP in the last 168 hours.  Microbiology: Results for orders placed or performed during the hospital encounter of 04/29/10  GC/chlamydia probe amp, genital     Status: None   Collection Time: 04/29/10 10:41 PM  Result Value Ref Range Status   GC Probe Amp, Genital  NEGATIVE Final    NEGATIVE (NOTE)  Testing performed using the BD ProbeTec Qx Chlamydia trachomatis and Neisseria gonorrhea amplified DNA assay.  Performed at:  [facility]               [address]                [place], [place] [address]               [id number]   Chlamydia, DNA Probe  NEGATIVE Final    NEGATIVE (NOTE)  Testing performed using the BD ProbeTec Qx Chlamydia trachomatis and Neisseria gonorrhea amplified DNA assay.  Performed at:  [facility]               [address]                [place], [place] [address]               [id number]  Wet prep, genital     Status: Abnormal   Collection Time: 04/29/10 10:41 PM  Result Value Ref Range Status   Yeast Wet Prep HPF POC NONE SEEN NONE SEEN Final   Trich, Wet Prep NONE SEEN NONE SEEN Final   Clue Cells Wet Prep HPF POC RARE (A) NONE SEEN Final   WBC, Wet Prep HPF POC FEW (A) NONE SEEN Final  Urine culture     Status: None   Collection Time: 04/29/10 11:20 PM   Result Value Ref Range Status   Specimen Description URINE, RANDOM  Final   Special Requests NONE  Final   Colony Count >=100,000 COLONIES/ML  Final   Culture   Final    Multiple bacterial morphotypes present, none predominant. Suggest appropriate recollection if clinically indicated.   Report Status 05/02/2010 FINAL  Final    Coagulation Studies: No results for input(s): LABPROT, INR in the last 72 hours.  Imaging: Mr Brain Wo Contrast  Result Date: 10/10/2016 CLINICAL DATA:  30 year old female with sudden onset of nausea and dizziness. Possible medication reaction. On Keppra for seizure for the past year. Initial encounter. EXAM: MRI HEAD WITHOUT CONTRAST TECHNIQUE: Multiplanar, multiecho pulse sequences of the brain and surrounding structures were obtained without  intravenous contrast. COMPARISON:  None. FINDINGS: Brain: No acute infarct or intracranial hemorrhage. No atrophy or hydrocephalus. No intracranial mass lesion noted on this unenhanced exam. Partially empty minimally prominent size sella without secondary findings of pseudotumor. High-resolution imaging not performed through the hippocampus however, no evidence of mesial temporal sclerosis. Vascular: Right vertebral artery is diminutive in size possibly ending in a posterior inferior cerebellar artery distribution. Remainder major intracranial vascular structures are patent. Skull and upper cervical spine: Negative. Sinuses/Orbits: No acute orbital abnormality. Inferior right maxillary sinus 1.2 cm structure may represent a retention cyst. Other: Top-normal size upper neck lymph nodes of indeterminate significance. IMPRESSION: No acute infarct or intracranial hemorrhage. No intracranial mass lesion noted on this unenhanced exam. 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 However if the patient had right-sided neck pain raising possibility of right vertebral artery dissection than CT angiogram or MR angiogram could be obtained for further delineation. Electronically Signed   By: Lacy Duverney M.D.   On: 10/10/2016 12:23       Assessment and plan per attending neurologist  Felicie Morn PA-C [facility] [phone]  10/10/2016, 1:48 PM   Assessment/Plan: This is a 30 year old female presenting with new onset of dizziness/vertigo/nausea/vomiting. Patient noted the symptoms appear the day after starting Keppra from a different manufacturer. She denies any recent flu's, head trauma, falls, neck trauma, or neck pain. MRI of the brain is negative however there is a comment about the right vertebral artery being smaller than usual and possibly vertebral artery dissection. Given patient's exam and history most likely this is not a vertebral dissection however we'll obtain a CTA of the head and neck to further rule out any dissection. Given her symptoms most likely represents BPPV versus vestibulitis. Patient did get some relief from Valium.  Command: 1) CTA head and neck to rule out vertebral dissection if negative no further workup warranted. 2) given her relief with benzodiazepines would recommend clonazepam 1 mg twice a day for 10 days if CTA is negative. 3) vestibular rehab 4) Discussed with pharmacist to look into manufacturer of Keppra as changing brands can sometimes cause side effects.    CTA of the head and neck without dissection. Neurology will sign off at this time.   Personally examined patient and images, and have participated in and made any corrections needed to history, physical, neuro exam,assessment and plan as stated above.  I have personally obtained the history, evaluated lab date, reviewed imaging studies and agree with radiology interpretations.    Naomie Dean, MD [facility]

## 2016-10-10 NOTE — ED Notes (Signed)
Per Dr. Criss AlvineGoldston tried ambulating pt. Pt didn't make it out of the bed due to dizziness and nausea.

## 2016-10-10 NOTE — ED Notes (Signed)
Pt a&o X4. Pt is still dizzy with any movement. Was able to use bedside commode with assistance. Pt is fine when lying still.

## 2016-10-10 NOTE — Progress Notes (Signed)
Patient's home medication of keppra 500mg  taken to pharmacy after being counted with patient. White copy placed in pt chart and med to be picked up upon d/c.

## 2016-10-10 NOTE — ED Provider Notes (Signed)
[facility]-EMERGENCY DEPT Provider Note   CSN: [id number] Arrival date & time: 10/10/16  [id number]     History   Chief Complaint Chief Complaint  Patient presents with  . Emesis  . Dizziness    HPI Katie Douglas is a 30 y.o. female.  HPI  30 year old female with a history of seizure disorder currently on Keppra presents with dizziness and vomiting that started 2 days ago. She states she's been having dizziness where it feels like she's gone to follow her in the room is spinning on and off for 2 days. At this point it is nearly constant. Denies any ear pain or ear ringing. There is no headache, neck pain, or focal weakness. She is also had nausea and vomiting no matter what she eats or drinks. The nausea and vomiting also worsens acutely with the dizziness. Dizziness is worse when standing. No chest pain or shortness of breath. No urinary symptoms. Last menstrual cycle was November 7. Had diarrhea a few days ago but this is gone. Is concerned her Keppra is causing this. She has been on Keppra before but she recently moved to this area and got a new prescription of Keppra that is made by different manufacturer. Started the new keppra the day before the dizziness/vomiting started.  Past Medical History:  Diagnosis Date  . Seizures [facility](HCC)     Patient Active Problem List   Diagnosis Date Noted  . Vertigo 10/10/2016    Past Surgical History:  Procedure Laterality Date  . CESAREAN SECTION N/A 2013    OB History    No data available       Home Medications    Prior to Admission medications   Medication Sig Start Date End Date Taking? Authorizing Provider  levETIRAcetam (KEPPRA) 500 MG tablet Take 1 tablet (500 mg total) by mouth 2 (two) times daily. 08/27/16  Yes John Molpus, MD  Multiple Vitamin (MULTIVITAMIN WITH MINERALS) TABS tablet Take 1 tablet by mouth daily.    Historical Provider, MD    Family History No family history on file.  Social History Social History  Substance  Use Topics  . Smoking status: Former Smoker    Quit date: 04/25/2012  . Smokeless tobacco: Never Used  . Alcohol use No     Allergies   Patient has no known allergies.   Review of Systems Review of Systems  Constitutional: Negative for fever.  Eyes: Negative for photophobia and visual disturbance.  Respiratory: Negative for shortness of breath.   Cardiovascular: Negative for chest pain.  Gastrointestinal: Positive for nausea and vomiting. Negative for abdominal pain.  Musculoskeletal: Negative for neck pain.  Neurological: Positive for dizziness. Negative for weakness, numbness and headaches.  All other systems reviewed and are negative.    Physical Exam Updated Vital Signs BP 116/94 (BP Location: Right Arm) Comment: Simultaneous filing. User may not have seen previous data.  Pulse 81 Comment: Simultaneous filing. User may not have seen previous data.  Temp 98.1 F (36.7 C) (Oral)   Resp 16   Ht 5\' 2"  (1.575 m)   Wt 180 lb (81.6 kg)   LMP 09/27/2016 (Exact Date)   SpO2 100% Comment: Simultaneous filing. User may not have seen previous data.  BMI 32.92 kg/m   Physical Exam  Constitutional: She is oriented to person, place, and time. She appears well-developed and well-nourished.  HENT:  Head: Normocephalic and atraumatic.  Right Ear: External ear normal.  Left Ear: External ear normal.  Nose: Nose normal.  Eyes:  EOM are normal. Pupils are equal, round, and reactive to light. Right eye exhibits no discharge. Left eye exhibits no discharge.  ?slight nystagmus, hard to reproduce  Neck: Neck supple.  Cardiovascular: Normal rate, regular rhythm and normal heart sounds.   Pulmonary/Chest: Effort normal and breath sounds normal.  Abdominal: Soft. There is no tenderness.  Neurological: She is alert and oriented to person, place, and time.  CN 3-12 grossly intact. 5/5 strength in all 4 extremities. Grossly normal sensation. Normal finger to nose. Normal heel to shin. Able to  walk a short distance without ataxia but had to sit down after a few steps due to having to vomit  Skin: Skin is warm and dry.  Nursing note and vitals reviewed.    ED Treatments / Results  Labs (all labs ordered are listed, but only abnormal results are displayed) Labs Reviewed  BASIC METABOLIC PANEL - Abnormal; Notable for the following:       Result Value   CO2 20 (*)    Glucose, Bld 119 (*)    All other components within normal limits  CBC WITH DIFFERENTIAL/PLATELET  I-STAT BETA HCG BLOOD, ED ([facility], [facility], [facility] ONLY)    EKG  EKG Interpretation  Date/Time:  Monday October 10 2016 09:20:35 EST Ventricular Rate:  56 PR Interval:    QRS Duration: 100 QT Interval:  430 QTC Calculation: 415 R Axis:   67 Text Interpretation:  Sinus bradycardia Probable left ventricular hypertrophy rate is slower compared to Oct 2017 Confirmed by Criss Alvine MD, Rylea Selway [phone]) on 10/10/2016 9:41:58 AM       Radiology Ct Angio Head W Or Douglas Contrast  Result Date: 10/10/2016 CLINICAL DATA:  Vertigo. EXAM: CT ANGIOGRAPHY HEAD AND NECK TECHNIQUE: Multidetector CT imaging of the head and neck was performed using the standard protocol during bolus administration of intravenous contrast. Multiplanar CT image reconstructions and MIPs were obtained to evaluate the vascular anatomy. Carotid stenosis measurements (when applicable) are obtained utilizing NASCET criteria, using the distal internal carotid diameter as the denominator. CONTRAST:  50 cc Isovue 370 intravenous COMPARISON:  None. FINDINGS: CT HEAD FINDINGS Brain: Normal. No evidence of acute infarction, hemorrhage, hydrocephalus, extra-axial collection or mass lesion/mass effect. Vascular: Described below Skull: No fracture or destructive lesion. Sinuses:   Mucous retention cysts in the right maxillary antrum. Orbits: Gaze to the right thumb also seen on the previous brain MRI. Review of the MIP images confirms the above findings CTA NECK FINDINGS Aortic arch:  Normal.  Normal.  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 No atheromatous changes, stenosis, dissection, or beading. Left carotid system: No atheromatous changes, stenosis, dissection, or beading. Vertebral arteries: Strong left vertebral artery dominance. No dissection or stenosis. Skeleton: No acute or aggressive finding. Other neck: Negative for adenopathy or mass. Upper chest: Negative Review of the MIP images confirms the above findings CTA HEAD FINDINGS Anterior circulation: Other than aplastic right A1 segment the circle-of-Willis is intact. No stenosis, beading, major branch occlusion, or aneurysm. Posterior circulation: Non dominant right vertebral artery ends in PICA. No stenosis, beading, or major branch occlusion. Negative for aneurysm. Venous sinuses: Patent Anatomic variants: None unusual Delayed phase: Review of the MIP images confirms the above findings IMPRESSION: Negative CTA of the head and neck. 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 Electronically Signed   By: Marnee Spring M.D.   On: 10/10/2016 15:35  Ct Angio Neck W Or Douglas Contrast  Result Date: 10/10/2016 CLINICAL DATA:  Vertigo. EXAM: CT ANGIOGRAPHY HEAD AND NECK TECHNIQUE: Multidetector CT imaging of the head and neck was performed using the standard protocol during bolus administration of intravenous contrast. Multiplanar CT image reconstructions and MIPs were obtained to evaluate the vascular anatomy. Carotid stenosis measurements (when applicable) are obtained utilizing NASCET criteria, using the distal internal carotid diameter as the denominator. CONTRAST:  50 cc Isovue 370 intravenous COMPARISON:  None. FINDINGS: CT HEAD FINDINGS Brain: Normal. No evidence of acute infarction, hemorrhage, hydrocephalus, extra-axial collection or mass lesion/mass effect. Vascular: Described below Skull:  No fracture or destructive lesion. Sinuses:   Mucous retention cysts in the right maxillary antrum. Orbits: Gaze to the right thumb also seen on the previous brain MRI. Review of the MIP images confirms the above findings CTA NECK FINDINGS Aortic arch: Normal.  Normal.  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 No atheromatous changes, stenosis, dissection, or beading. Left carotid system: No atheromatous changes, stenosis, dissection, or beading. Vertebral arteries: Strong left vertebral artery dominance. No dissection or stenosis. Skeleton: No acute or aggressive finding. Other neck: Negative for adenopathy or mass. Upper chest: Negative Review of the MIP images confirms the above findings CTA HEAD FINDINGS Anterior circulation: Other than aplastic right A1 segment the circle-of-Willis is intact. No stenosis, beading, major branch occlusion, or aneurysm. Posterior circulation: Non dominant right vertebral artery ends in PICA. No stenosis, beading, or major branch occlusion. Negative for aneurysm. Venous sinuses: Patent Anatomic variants: None unusual Delayed phase: Review of the MIP images confirms the above findings IMPRESSION: Negative CTA of the head and neck. 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 Electronically Signed   By: Marnee SpringJonathon  Watts M.D.   On: 10/10/2016 15:35   Katie Douglas Contrast  Result Date: 10/10/2016 CLINICAL DATA:  30 year old female with sudden onset of nausea and dizziness. Possible medication reaction. On Keppra for seizure for the past year. Initial encounter. EXAM: MRI HEAD WITHOUT CONTRAST TECHNIQUE: Multiplanar, multiecho pulse sequences of the brain and surrounding structures were obtained without intravenous contrast. COMPARISON:  None. FINDINGS: Brain: No acute infarct or intracranial hemorrhage. No atrophy or hydrocephalus. No intracranial  mass lesion noted on this unenhanced exam. Partially empty minimally prominent size sella without secondary findings of pseudotumor. High-resolution imaging not performed through the hippocampus however, no evidence of mesial temporal sclerosis. Vascular: Right vertebral artery is diminutive in size possibly ending in a posterior inferior cerebellar artery distribution. Remainder major intracranial vascular structures are patent. Skull and upper cervical spine: Negative. Sinuses/Orbits: No acute orbital abnormality. Inferior right maxillary sinus 1.2 cm structure may represent a retention cyst. Other: Top-normal size upper neck lymph nodes of indeterminate significance. IMPRESSION: No acute infarct or intracranial hemorrhage. No intracranial mass lesion noted on this unenhanced exam. 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 However if the patient had right-sided neck pain raising possibility of right vertebral artery dissection than CT angiogram or Katie angiogram could be obtained for further delineation. Electronically Signed   By: Lacy DuverneySteven  Olson M.D.   On: 10/10/2016 12:23    Procedures Procedures (including critical care time)  Medications Ordered in ED Medications  ondansetron (ZOFRAN) injection 4 mg (4 mg Intravenous Given  10/10/16 0931)  sodium chloride 0.9 % bolus 1,000 mL (0 mLs Intravenous Stopped 10/10/16 1106)  diazepam (VALIUM) injection 5 mg (5 mg Intravenous Given 10/10/16 0931)  meclizine (ANTIVERT) tablet 25 mg (25 mg Oral Given 10/10/16 1109)  iopamidol (ISOVUE-370) 76 % injection (50 mLs  Contrast Given 10/10/16 1500)  promethazine (PHENERGAN) injection 25 mg (25 mg Intravenous Given 10/10/16 1615)     Initial Impression / Assessment and Plan / ED Course  I have reviewed the triage vital signs and the  nursing notes.  Pertinent labs & imaging results that were available during my care of the patient were reviewed by me and considered in my medical decision making (see chart for details).  Clinical Course as of Oct 11 1647  Mon Oct 10, 2016  0910 There seems to be slight nystagmus, but it's hard to reproduce. Will treat as vertigo with valium, zofran, fluids  [SG]  1045 Patient feels better. Labs unremarkable, mild decrease in bicarb likely from vomiting but anion gap normal. Will ambulate  [SG]  1055 Patient could not stand up due to acute vertigo and nausea. Will try meclizine and get MRI given refractory symptoms that have been present for a few days  [SG]  1258 MRI negative. Feels better. However when I walk her after a few steps she gets acutely dizzy again and had to sit down. Doesn't feel comfortable going home. Consult neuro  [SG]  1313 Dr. Lucia Gaskins, neuro, to come eval  [SG]  1647 Marlowe Kays to admit, tele obs  [SG]    Clinical Course User Index [SG] Pricilla Loveless, MD    Patient still unable to get up and walk due to persistent vertigo and nausea and vomiting. Due to this, keep on fluids and admit to the hospitalist for overnight observation and further care.  Final Clinical Impressions(s) / ED Diagnoses   Final diagnoses:  Peripheral vertigo, unspecified laterality    New Prescriptions New Prescriptions   No medications on file     Pricilla Loveless, MD 10/10/16 [phone]

## 2016-10-10 NOTE — ED Triage Notes (Signed)
Pt presents for evaluation of possible medication reaction. Pt. Has been taking keppra for sz x1 year with no issues. Pt states she recently moved from [place] and refilled prescription here. States has been taking medication x2 days and has had emesis and dizziness.

## 2016-10-11 DIAGNOSIS — R42 Dizziness and giddiness: Secondary | ICD-10-CM | POA: Diagnosis not present

## 2016-10-11 LAB — COMPREHENSIVE METABOLIC PANEL
ALBUMIN: 3.8 g/dL (ref 3.5–5.0)
ALK PHOS: 39 U/L (ref 38–126)
ALT: 15 U/L (ref 14–54)
AST: 37 U/L (ref 15–41)
Anion gap: 11 (ref 5–15)
BUN: 9 mg/dL (ref 6–20)
CALCIUM: 8.8 mg/dL — AB (ref 8.9–10.3)
CO2: 20 mmol/L — AB (ref 22–32)
CREATININE: 0.83 mg/dL (ref 0.44–1.00)
Chloride: 105 mmol/L (ref 101–111)
GFR calc Af Amer: >60 mL/min (ref >60)
GFR calc non Af Amer: >60 mL/min (ref >60)
GLUCOSE: 90 mg/dL (ref 65–99)
Potassium: 4.1 mmol/L (ref 3.5–5.1)
SODIUM: 136 mmol/L (ref 135–145)
Total Bilirubin: 2.3 mg/dL — ABNORMAL HIGH (ref 0.3–1.2)
Total Protein: 6.3 g/dL — ABNORMAL LOW (ref 6.5–8.1)

## 2016-10-11 LAB — CBC
HCT: 40.7 % (ref 36.0–46.0)
Hemoglobin: 13.2 g/dL (ref 12.0–15.0)
MCH: 27.6 pg (ref 26.0–34.0)
MCHC: 32.4 g/dL (ref 30.0–36.0)
MCV: 85.1 fL (ref 78.0–100.0)
PLATELETS: 304 10*3/uL (ref 150–400)
RBC: 4.78 MIL/uL (ref 3.87–5.11)
RDW: 12.4 % (ref 11.5–15.5)
WBC: 7.9 10*3/uL (ref 4.0–10.5)

## 2016-10-11 MED ORDER — LEVETIRACETAM 500 MG PO TABS
500.0000 mg | ORAL_TABLET | Freq: Two times a day (BID) | ORAL | Status: DC
  Administered 2016-10-11 – 2016-10-12 (×2): 500 mg via ORAL
  Filled 2016-10-11 (×2): qty 1

## 2016-10-11 MED ORDER — PREDNISONE 50 MG PO TABS
50.0000 mg | ORAL_TABLET | Freq: Every day | ORAL | Status: DC
  Administered 2016-10-11 – 2016-10-12 (×2): 50 mg via ORAL
  Filled 2016-10-11 (×2): qty 1

## 2016-10-11 MED ORDER — MECLIZINE HCL 12.5 MG PO TABS
25.0000 mg | ORAL_TABLET | Freq: Three times a day (TID) | ORAL | Status: DC
  Administered 2016-10-11 – 2016-10-12 (×4): 25 mg via ORAL
  Filled 2016-10-11 (×4): qty 2

## 2016-10-11 NOTE — Progress Notes (Signed)
Patient ambulated to bathroom and back to bed, pt still c/o dizziness and nausease but no emesis reported. Meclizine given this AM. Urine sample obtained and sent.   Sim BoastHavy, RN

## 2016-10-11 NOTE — Evaluation (Signed)
Physical Therapy Evaluation Patient Details Name: Katie Douglas MRN: [id number] DOB: [birth year] Today's Date: 10/11/2016   History of Present Illness  30 year old female presenting with new onset of dizziness/vertigo/nausea/vomiting. Patient noted the symptoms appear the day after starting Keppra from a different manufacturer. She denies any recent flu's, head trauma, falls, neck trauma, or neck pain. MRI of the brain is negative however there is a comment about the right vertebral artery being smaller than usual. CT angiogram negative for Rt vertebral artery dissection.     Clinical Impression  Pt admitted with above symptoms. Bil hallpike-Dix and supine head roll testing negative for BPPV. 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 Patient educated in visual compensatory techniques to minimize vertigo and preferred use of meclizine (can slow recovery and slow effectiveness of vestibular rehab, however if severe vertigo with near falls or vomiting, she should continue to use). Spoke with Dr. Allena Katz re: results. Pt currently with functional limitations due to the deficits listed below (see PT Problem List). Pt will benefit from skilled PT to increase their independence and safety with mobility to allow discharge to the venue listed below.       Follow Up Recommendations Outpatient PT (vestibular rehab)    Equipment Recommendations  None recommended by PT      10/11/16 1602  Vestibular Assessment  General Observation Reports dizziness (feels she is spinning or floor moves sometimes). Lasts as long as she is moving  Symptom Behavior  Type of Dizziness Spinning  Frequency of Dizziness with movement  Duration of Dizziness while moving  Aggravating Factors Activity in general  Relieving Factors Head stationary;Lying supine  Occulomotor Exam  Occulomotor Alignment Normal  Spontaneous Absent  Gaze-induced Absent  Smooth Pursuits  Intact  Vestibulo-Occular Reflex  VOR 1 Head Only (x 1 viewing) unable to do more than 5 reps each direction very slowly with significant incr in symptoms  VOR Cancellation Normal (but pt moving slowly)  Positional Testing  Dix-Hallpike Dix-Hallpike Right;Dix-Hallpike Left  Horizontal Canal Testing Horizontal Canal Right;Horizontal Canal Left  Dix-Hallpike Right  Dix-Hallpike Right Duration 0  Dix-Hallpike Right Symptoms No nystagmus (asymptomatic)  Dix-Hallpike Left  Dix-Hallpike Left Duration 0  Dix-Hallpike Left Symptoms No nystagmus (asymptomatic)  Horizontal Canal Right  Horizontal Canal Right Duration 0  Horizontal Canal Right Symptoms Normal  Horizontal Canal Left  Horizontal Canal Left Duration 0  Horizontal Canal Left Symptoms Normal  Positional Sensitivities  Supine to Sitting 4  Up from Right Hallpike 4  Up from Left Hallpike 4  Head Turning x 5 4    Recommendations for Other Services       Precautions / Restrictions Precautions Precautions: Fall      Mobility  Bed Mobility Overal bed mobility: Needs Assistance Bed Mobility: Supine to Sit;Sit to Supine;Sit to Sidelying     Supine to sit: Supervision Sit to supine: Supervision Sit to sidelying: Supervision General bed mobility comments: supervision due to dizziness (moderately severe)  Transfers Overall transfer level: Needs assistance Equipment used: None   Sit to Stand: Min guard         General transfer comment: educated in using visual compensation with dizziness reduced  Ambulation/Gait Ambulation/Gait assistance: Min assist Ambulation Distance (Feet): 15 Feet (x2) Assistive device: None Gait Pattern/deviations: Step-through pattern;Decreased stride length;Wide base of support   Gait velocity interpretation: Below normal speed for age/gender General Gait Details: educated on visual compensation--pt able to tolerate moving head with eyes to next target and then  turning body wihtout  LOB  Stairs            Wheelchair Mobility    Modified Rankin (Stroke Patients Only)       Balance Overall balance assessment: Needs assistance         Standing balance support: No upper extremity supported Standing balance-Leahy Scale: Fair Standing balance comment: with movement incr dizziness and imbalance                             Pertinent Vitals/Pain Pain Assessment: No/denies pain    Home Living Family/patient expects to be discharged to:: Private residence Living Arrangements: Children (4 yo daughter) Available Help at Discharge: Friend(s);Available PRN/intermittently                  Prior Function Level of Independence: Independent         Comments: works at a [employer] lot     Hand Dominance        Extremity/Trunk Assessment   Upper Extremity Assessment: Overall WFL for tasks assessed           Lower Extremity Assessment: Overall WFL for tasks assessed      Cervical / Trunk Assessment: Normal  Communication   Communication: No difficulties  Cognition Arousal/Alertness: Awake/alert Behavior During Therapy: WFL for tasks assessed/performed Overall Cognitive Status: Within Functional Limits for tasks assessed                      General Comments      Exercises     Assessment/Plan    PT Assessment Patient needs continued PT services  PT Problem List Decreased activity tolerance;Decreased mobility;Decreased balance;Other (comment) (vertigo)          PT Treatment Interventions Gait training;Stair training;Functional mobility training;Therapeutic activities;Therapeutic exercise;Balance training;Patient/family education    PT Goals (Current goals can be found in the Care Plan section)  Acute Rehab PT Goals Patient Stated Goal: stop dizziness so she can care for her daughter and go to work PT Goal Formulation: With patient Time For Goal Achievement: 10/18/16 Potential to Achieve Goals: Good     Frequency Min 5X/week   Barriers to discharge Decreased caregiver support (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)      Co-evaluation               End of Session   Activity Tolerance: Patient tolerated treatment well (pt s/p 3 doses of meclizine) Patient left: in bed;with call bell/phone within reach;with bed alarm set Nurse Communication: Mobility status    Functional Assessment Tool Used: clinical judgment Functional Limitation: Mobility: Walking and moving around Mobility: Walking and Moving Around Current Status ([id number](G8978): At least 40 percent but less than 60 percent impaired, limited or restricted Mobility: Walking and Moving Around Goal Status [phone]([id number]): 0 percent impaired, limited or restricted    Time: [id number] PT Time Calculation (min) (ACUTE ONLY): 41 min   Charges:   PT Evaluation $PT Eval Low Complexity: 1 Procedure PT Treatments $Therapeutic Activity: 8-22 mins $Self Care/Home Management: 8-22   PT G Codes:   PT G-Codes **NOT FOR INPATIENT CLASS** Functional Assessment Tool Used: clinical judgment Functional Limitation: Mobility: Walking and moving around Mobility: Walking and Moving Around Current Status ([id number](G8978): At least 40 percent but less than 60 percent impaired, limited or restricted Mobility: Walking and Moving Around Goal Status [phone](G8979): 0 percent impaired, limited or restricted    Computer Sciences CorporationLynn P Jaqualyn Juday  10/11/2016, 4:22 PM Pager [phone]

## 2016-10-11 NOTE — Progress Notes (Signed)
[facility] Progress Note  Patient: Katie Douglas VHQ:[id number]   PCP: No PCP Per Patient DOB: 10/14/1986   DOA: 10/10/2016   DOS: 10/11/2016   Date of Service: the patient was seen and examined on 10/11/2016  Brief hospital course: Pt. with PMH of Seizures; admitted on 10/10/2016, with complaint of vertigo, was found to have dizziness. Currently further plan is monitor for further workup.  Assessment and Plan: 1. Vertigo Neurology recommended clonazepam as well as vascular rehabilitation. Feels that this is likely associated with patient's Keppra change. No dissection on CTA. Neurology currently signed off. Patient mentions her symptoms are gradually improving. Physical therapy does not feel that the patient has BPPV. We'll start the patient on scheduled meclizine 3 times a day as well as prednisone and monitor improvement.  2. History of seizures. Resuming Keppra.   Pain management: When necessary Tylenol Activity: Consulted physical therapy Bowel regimen: last BM prior to admission Diet: Regular diet DVT Prophylaxis: subcutaneous Heparin  Advance goals of care discussion: Full code  Family Communication: no family was present at bedside, at the time of interview.   Disposition:  Discharge to home. Expected discharge date: 10/12/2016, improvement in symptoms  Consultants: Neurology Procedures: None  Antibiotics: Anti-infectives    None        Subjective: Significantly dizzy once standing up and going to the restroom this morning. Continues to remain dizzy whenever she changes her position.  Objective: Physical Exam: Vitals:   10/11/16 0212 10/11/16 0459 10/11/16 1021 10/11/16 1420  BP: (!) 108/56 108/66 94/63 (!) 99/54  Pulse: 64 73 80 64  Resp: 18 16 18 18   Temp: 98.1 F (36.7 C) 99.2 F (37.3 C) 98.9 F (37.2 C) 99.2 F (37.3 C)  TempSrc: Oral Oral Oral Oral  SpO2: 100% 100% 100% 100%  Weight:  80.4 kg (177 lb 4 oz)    Height:         Intake/Output Summary (Last 24 hours) at 10/11/16 1712 Last data filed at 10/11/16 1200  Gross per 24 hour  Intake             1020 ml  Output                0 ml  Net             1020 ml   Filed Weights   10/10/16 0842 10/11/16 0459  Weight: 81.6 kg (180 lb) 80.4 kg (177 lb 4 oz)    General: Alert, Awake and Oriented to Time, Place and Person. Appear in mild distress, affect appropriate Eyes: PERRL, Conjunctiva normal ENT: Oral Mucosa clear moist. Neck: no JVD, no Abnormal Mass Or lumps Cardiovascular: S1 and S2 Present, no Murmur, Respiratory: Bilateral Air entry equal and Decreased, no use of accessory muscle, Clear to Auscultation, no Crackles, no wheezes Abdomen: Bowel Sound present, Soft and no tenderness Skin: no redness, no Rash, no induration Extremities: no Pedal edema, no calf tenderness Neurologic: Grossly no focal neuro deficit. Bilaterally Equal motor strength No nystagmus present  Data Reviewed: CBC:  Recent Labs Lab 10/10/16 0934 10/11/16 0254  WBC 9.5 7.9  NEUTROABS 7.4  --   HGB 14.0 13.2  HCT 42.8 40.7  MCV 85.6 85.1  PLT 264 304   Basic Metabolic Panel:  Recent Labs Lab 10/10/16 0934 10/11/16 0254  NA 139 136  K 3.6 4.1  CL 106 105  CO2 20* 20*  GLUCOSE 119* 90  BUN 11 9  CREATININE 0.98 0.83  CALCIUM 9.6 8.8*    Liver Function Tests:  Recent Labs Lab 10/11/16 0254  AST 37  ALT 15  ALKPHOS 39  BILITOT 2.3*  PROT 6.3*  ALBUMIN 3.8   Studies: No results found.   Scheduled Meds: . clonazePAM  1 mg Oral BID  . enoxaparin (LOVENOX) injection  40 mg Subcutaneous Q24H  . levETIRAcetam  500 mg Oral BID  . meclizine  25 mg Oral TID  . predniSONE  50 mg Oral Q breakfast  . sodium chloride flush  3 mL Intravenous Q12H   Continuous Infusions: PRN Meds: acetaminophen **OR** acetaminophen, bisacodyl, HYDROcodone-acetaminophen, magnesium citrate, ondansetron (ZOFRAN) IV, promethazine, senna-docusate  Time spent: 30  minutes  Author: Lynden OxfordPranav Raedyn Wenke, MD [facility] Pager: [phone] 10/11/2016 5:12 PM  If 7PM-7AM, please contact night-coverage at [email or web address], password [email or web address]

## 2016-10-12 DIAGNOSIS — R569 Unspecified convulsions: Secondary | ICD-10-CM

## 2016-10-12 DIAGNOSIS — R42 Dizziness and giddiness: Secondary | ICD-10-CM | POA: Diagnosis not present

## 2016-10-12 LAB — URINE CULTURE

## 2016-10-12 LAB — BASIC METABOLIC PANEL
ANION GAP: 7 (ref 5–15)
BUN: <5 mg/dL — ABNORMAL LOW (ref 6–20)
CALCIUM: 9.4 mg/dL (ref 8.9–10.3)
CHLORIDE: 107 mmol/L (ref 101–111)
CO2: 24 mmol/L (ref 22–32)
CREATININE: 0.81 mg/dL (ref 0.44–1.00)
GFR calc Af Amer: >60 mL/min (ref >60)
GFR calc non Af Amer: >60 mL/min (ref >60)
GLUCOSE: 129 mg/dL — AB (ref 65–99)
Potassium: 3.9 mmol/L (ref 3.5–5.1)
Sodium: 138 mmol/L (ref 135–145)

## 2016-10-12 LAB — CBC
HCT: 42.7 % (ref 36.0–46.0)
Hemoglobin: 14.3 g/dL (ref 12.0–15.0)
MCH: 28.1 pg (ref 26.0–34.0)
MCHC: 33.5 g/dL (ref 30.0–36.0)
MCV: 83.9 fL (ref 78.0–100.0)
Platelets: 314 K/uL (ref 150–400)
RBC: 5.09 MIL/uL (ref 3.87–5.11)
RDW: 12 % (ref 11.5–15.5)
WBC: 8.3 K/uL (ref 4.0–10.5)

## 2016-10-12 MED ORDER — CLONAZEPAM 1 MG PO TABS: 1.0000 mg | ORAL_TABLET | Freq: Two times a day (BID) | ORAL | 0 refills | Status: DC

## 2016-10-12 MED ORDER — MECLIZINE HCL 25 MG PO TABS: 25.0000 mg | ORAL_TABLET | Freq: Three times a day (TID) | ORAL | 0 refills | Status: DC

## 2016-10-12 NOTE — Discharge Instructions (Signed)
Katie Douglas was admitted to the Hospital on 10/10/2016 and Discharged on Discharge Date 10/12/2016 and should be excused from work/school   for 3   days starting 10/10/2016 , may return to work/school without any restrictions.  Call Lambert KetoAbraham Feliz MD, [facility] [phone] with questions.  Marinda ElkFELIZ ORTIZ, Tryson Lumley M.D on 10/12/2016,at 9:37 AM  [facility] Office  [phone]  The patient cannot drive or operate heavy machin early while on clonazepam and Antivert.

## 2016-10-12 NOTE — Progress Notes (Signed)
Physical Therapy Treatment Patient Details Name: Katie SpinnerJessica A Douglas MRN: [id number] DOB: 01/21/1986 Today's Date: 10/12/2016    History of Present Illness 30 year old female presenting with new onset of dizziness/vertigo/nausea/vomiting. Patient noted the symptoms appear the day after starting Keppra from a different manufacturer. She denies any recent flu's, head trauma, falls, neck trauma, or neck pain. MRI of the brain is negative however there is a comment about the right vertebral artery being smaller than usual. CT angiogram negative for Rt vertebral artery dissection.     PT Comments    Patient doing much better, however continues to have dizziness with mobility (bed, transfers, and gait). She is able to demonstrate use of visual compensatory techniques to decr dizziness. Patient is mobilizing well enough to discharge home with intermittent supervision of her godparents/neighbors.   Follow Up Recommendations  Outpatient PT (vestibular rehab; MD did not order intentionally per his note; pt educated to request at her follow-up Neuro appointment if continues to have issues)     Equipment Recommendations  None recommended by PT    Recommendations for Other Services       Precautions / Restrictions Precautions Precautions: Fall    Mobility  Bed Mobility                  Transfers                    Ambulation/Gait             General Gait Details: pt able to utilize visual targets to control her dizziness; noted unsteady when walking with head turn (independently recovers)   Information systems managertairs            Wheelchair Mobility    Modified Rankin (Stroke Patients Only)       Balance                                    Cognition Arousal/Alertness: Awake/alert Behavior During Therapy: WFL for tasks assessed/performed Overall Cognitive Status: Within Functional Limits for tasks assessed                      Exercises Other  Exercises Other Exercises: Educated in proper technique for "x1" vestibular exercises. Educated to turn head fast enough to generate 5-6/10 dizziness. Duration, reps, sessions per day. When to advance to next harder position.    General Comments        Pertinent Vitals/Pain Pain Assessment: No/denies pain    Home Living                      Prior Function            PT Goals (current goals can now be found in the care plan section) Acute Rehab PT Goals Patient Stated Goal: stop dizziness so she can care for her daughter and go to work Time For Goal Achievement: 10/18/16 Progress towards PT goals: Progressing toward goals    Frequency    Min 5X/week      PT Plan Current plan remains appropriate    Co-evaluation             End of Session   Activity Tolerance: Patient tolerated treatment well Patient left: in bed;with call bell/phone within reach     Time: 1116-1131 PT Time Calculation (min) (ACUTE ONLY): 15 min  Charges:  $Therapeutic Exercise: 8-22 mins  G CodesZena Amos:      Katie Douglas 10/12/2016, 12:04 PM Pager [phone]

## 2016-10-12 NOTE — Care Management Note (Signed)
Case Management Note  Patient Details  Name: Katie Douglas MRN: [id number] Date of Birth: Sep 11, 1986  Subjective/Objective:                    Action/Plan: Pt discharging home with self care. She is from home with her children. CM consulted for outpatient therapy. CM met with the patient and discussed outpatient therapy. She was interested in [employer]. Orders placed in EPIC and information on the AVS.   Expected Discharge Date:                  Expected Discharge Plan:  Home/Self Care  In-House Referral:     Discharge planning Services  CM Consult  Post Acute Care Choice:    Choice offered to:     DME Arranged:    DME Agency:     HH Arranged:    [place] Agency:     Status of Service:  Completed, signed off  If discussed at [employer] of Stay Meetings, dates discussed:    Additional Comments:  Pollie Friar, RN 10/12/2016, 11:27 AM

## 2016-10-12 NOTE — Discharge Summary (Signed)
Physician Discharge Summary  Katie SpinnerJessica A Douglas UJW:[id number] DOB: 09/25/1986 DOA: 10/10/2016  PCP: No PCP Per Patient  Admit date: 10/10/2016 Discharge date: 10/12/2016  Admitted From: home Disposition:  Home  Recommendations for Outpatient Follow-up:  1. Follow up with Neurology in 1-2 weeks, probably her clonazepam and antivert can be DC on follow-up. 2. Please obtain BMP/CBC in one week  Home Health:Noo (YES/NO) Equipment/Devices:none  Discharge Condition:stable  CODE STATUS:Full Diet recommendation: Regular  Brief/Interim Summary: 30 year old female with past medical history of seizure on chronic Keppra who presents with acute onset of vertigo after she was given new prescription  Discharge Diagnoses:  Vertigo Neurology was consulted and recommended a CTA that showed no dissection. She was started on benzodiazepine which improve her symptoms. She was also started on meclizine and vascular rehabilitation was consulted. Neurology thinks the change in the brain can sometimes cause side effects. Physical therapy evaluated the patient they recommended no intervention as her symptoms have resolved. She will continue meclizine and clonazepam and she will follow-up with neurology as an outpatient and these could be DC at that point. She is not to drive or operate heavy maturity on these 2 medications.  Seizures (HCC) No change made to her regimen.   Discharge Instructions  Discharge Instructions    Diet - low sodium heart healthy    Complete by:  As directed    Increase activity slowly    Complete by:  As directed        Medication List    STOP taking these medications   multivitamin with minerals Tabs tablet     TAKE these medications   clonazePAM 1 MG tablet Commonly known as:  KLONOPIN Take 1 tablet (1 mg total) by mouth 2 (two) times daily.   levETIRAcetam 500 MG tablet Commonly known as:  KEPPRA Take 1 tablet (500 mg total) by mouth 2 (two) times daily.    meclizine 25 MG tablet Commonly known as:  ANTIVERT Take 1 tablet (25 mg total) by mouth 3 (three) times daily. 3 times a day for the next 3 days then take it 3 times a day as needed.       No Known Allergies  Consultations:  Neurology   Procedures/Studies: Ct Angio Head W Or Wo Contrast  Result Date: 10/10/2016 CLINICAL DATA:  Vertigo. EXAM: CT ANGIOGRAPHY HEAD AND NECK TECHNIQUE: Multidetector CT imaging of the head and neck was performed using the standard protocol during bolus administration of intravenous contrast. Multiplanar CT image reconstructions and MIPs were obtained to evaluate the vascular anatomy. Carotid stenosis measurements (when applicable) are obtained utilizing NASCET criteria, using the distal internal carotid diameter as the denominator. CONTRAST:  50 cc Isovue 370 intravenous COMPARISON:  None. FINDINGS: CT HEAD FINDINGS Brain: Normal. No evidence of acute infarction, hemorrhage, hydrocephalus, extra-axial collection or mass lesion/mass effect. Vascular: Described below Skull: No fracture or destructive lesion. Sinuses:   Mucous retention cysts in the right maxillary antrum. Orbits: Gaze to the right thumb also seen on the previous brain MRI. Review of the MIP images confirms the above findings CTA NECK FINDINGS Aortic arch: Normal.  Normal.  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 No atheromatous changes, stenosis, dissection, or beading. Left carotid system: No atheromatous changes, stenosis, dissection, or beading. Vertebral arteries: Strong left vertebral artery dominance. No dissection or stenosis. Skeleton: No acute or aggressive finding. Other neck: Negative for adenopathy or mass. Upper  chest: Negative Review of the MIP images confirms the above findings CTA HEAD FINDINGS Anterior circulation: Other than aplastic right A1 segment the circle-of-Willis is intact. No  stenosis, beading, major branch occlusion, or aneurysm. Posterior circulation: Non dominant right vertebral artery ends in PICA. No stenosis, beading, or major branch occlusion. Negative for aneurysm. Venous sinuses: Patent Anatomic variants: None unusual Delayed phase: Review of the MIP images confirms the above findings IMPRESSION: Negative CTA of the head and neck. 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 Electronically Signed   By: Marnee Spring M.D.   On: 10/10/2016 15:35   Ct Angio Neck W Or Wo Contrast  Result Date: 10/10/2016 CLINICAL DATA:  Vertigo. EXAM: CT ANGIOGRAPHY HEAD AND NECK TECHNIQUE: Multidetector CT imaging of the head and neck was performed using the standard protocol during bolus administration of intravenous contrast. Multiplanar CT image reconstructions and MIPs were obtained to evaluate the vascular anatomy. Carotid stenosis measurements (when applicable) are obtained utilizing NASCET criteria, using the distal internal carotid diameter as the denominator. CONTRAST:  50 cc Isovue 370 intravenous COMPARISON:  None. FINDINGS: CT HEAD FINDINGS Brain: Normal. No evidence of acute infarction, hemorrhage, hydrocephalus, extra-axial collection or mass lesion/mass effect. Vascular: Described below Skull: No fracture or destructive lesion. Sinuses:   Mucous retention cysts in the right maxillary antrum. Orbits: Gaze to the right thumb also seen on the previous brain MRI. Review of the MIP images confirms the above findings CTA NECK FINDINGS Aortic arch: Normal.  Normal.  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 No atheromatous changes, stenosis, dissection, or beading. Left carotid system: No atheromatous changes, stenosis, dissection, or beading. Vertebral arteries: Strong left vertebral artery dominance. No dissection or stenosis. Skeleton: No acute or  aggressive finding. Other neck: Negative for adenopathy or mass. Upper chest: Negative Review of the MIP images confirms the above findings CTA HEAD FINDINGS Anterior circulation: Other than aplastic right A1 segment the circle-of-Willis is intact. No stenosis, beading, major branch occlusion, or aneurysm. Posterior circulation: Non dominant right vertebral artery ends in PICA. No stenosis, beading, or major branch occlusion. Negative for aneurysm. Venous sinuses: Patent Anatomic variants: None unusual Delayed phase: Review of the MIP images confirms the above findings IMPRESSION: Negative CTA of the head and neck. 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 Electronically Signed   By: Marnee Spring M.D.   On: 10/10/2016 15:35   Mr Brain Wo Contrast  Result Date: 10/10/2016 CLINICAL DATA:  30 year old female with sudden onset of nausea and dizziness. Possible medication reaction. On Keppra for seizure for the past year. Initial encounter. EXAM: MRI HEAD WITHOUT CONTRAST TECHNIQUE: Multiplanar, multiecho pulse sequences of the brain and surrounding structures were obtained without intravenous contrast. COMPARISON:  None. FINDINGS: Brain: No acute infarct or intracranial hemorrhage. No atrophy or hydrocephalus. No intracranial mass lesion noted on this unenhanced exam. Partially empty minimally prominent size sella without secondary findings of pseudotumor. High-resolution imaging not performed through the hippocampus however, no evidence of mesial temporal sclerosis. Vascular: Right vertebral artery is diminutive in size possibly ending in a posterior inferior cerebellar artery distribution. Remainder major intracranial vascular structures are patent. Skull and upper cervical spine: Negative. Sinuses/Orbits: No acute orbital abnormality. Inferior right maxillary sinus 1.2 cm structure may represent a retention cyst. Other: Top-normal size upper neck lymph nodes of  indeterminate significance. IMPRESSION: No acute infarct  or intracranial hemorrhage. No intracranial mass lesion noted on this unenhanced exam. 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 However if the patient had right-sided neck pain raising possibility of right vertebral artery dissection than CT angiogram or MR angiogram could be obtained for further delineation. Electronically Signed   By: Lacy DuverneySteven  Olson M.D.   On: 10/10/2016 12:23      Subjective: She relates her symptoms are almost gone.  Discharge Exam: Vitals:   10/12/16 0127 10/12/16 0532  BP: 110/76 (!) 100/51  Pulse: 89 82  Resp: 17 18  Temp: 98.7 F (37.1 C) 98.9 F (37.2 C)   Vitals:   10/11/16 1730 10/11/16 2100 10/12/16 0127 10/12/16 0532  BP: 110/68 104/66 110/76 (!) 100/51  Pulse: 69 72 89 82  Resp: 18 17 17 18   Temp: 98.5 F (36.9 C) 98.5 F (36.9 C) 98.7 F (37.1 C) 98.9 F (37.2 C)  TempSrc: Oral Oral Oral Oral  SpO2: 100% 100% 97% 100%  Weight:      Height:        General: Pt is alert, awake, not in acute distress Cardiovascular: RRR, S1/S2 +, no rubs, no gallops Respiratory: CTA bilaterally, no wheezing, no rhonchi Abdominal: Soft, NT, ND, bowel sounds + Extremities: no edema, no cyanosis    The results of significant diagnostics from this hospitalization (including imaging, microbiology, ancillary and laboratory) are listed below for reference.     Microbiology: No results found for this or any previous visit (from the past 240 hour(s)).   Labs: BNP (last 3 results) No results for input(s): BNP in the last 8760 hours. Basic Metabolic Panel:  Recent Labs Lab 10/10/16 0934 10/11/16 0254 10/12/16 0526  NA 139 136 138  K 3.6 4.1 3.9  CL 106 105 107  CO2 20* 20* 24  GLUCOSE 119* 90 129*  BUN 11 9 <5*  CREATININE  0.98 0.83 0.81  CALCIUM 9.6 8.8* 9.4   Liver Function Tests:  Recent Labs Lab 10/11/16 0254  AST 37  ALT 15  ALKPHOS 39  BILITOT 2.3*  PROT 6.3*  ALBUMIN 3.8   No results for input(s): LIPASE, AMYLASE in the last 168 hours. No results for input(s): AMMONIA in the last 168 hours. CBC:  Recent Labs Lab 10/10/16 0934 10/11/16 0254 10/12/16 0526  WBC 9.5 7.9 8.3  NEUTROABS 7.4  --   --   HGB 14.0 13.2 14.3  HCT 42.8 40.7 42.7  MCV 85.6 85.1 83.9  PLT 264 304 314   Cardiac Enzymes: No results for input(s): CKTOTAL, CKMB, CKMBINDEX, TROPONINI in the last 168 hours. BNP: Invalid input(s): POCBNP CBG: No results for input(s): GLUCAP in the last 168 hours. D-Dimer No results for input(s): DDIMER in the last 72 hours. Hgb A1c No results for input(s): HGBA1C in the last 72 hours. Lipid Profile No results for input(s): CHOL, HDL, LDLCALC, TRIG, CHOLHDL, LDLDIRECT in the last 72 hours. Thyroid function studies No results for input(s): TSH, T4TOTAL, T3FREE, THYROIDAB in the last 72 hours.  Invalid input(s): FREET3 Anemia work up No results for input(s): VITAMINB12, FOLATE, FERRITIN, TIBC, IRON, RETICCTPCT in the last 72 hours. Urinalysis    Component Value Date/Time   COLORURINE YELLOW 04/29/2010 2242   APPEARANCEUR CLEAR 04/29/2010 2242   LABSPEC 1.010 04/29/2010 2242   PHURINE 7.5 04/29/2010 2242   GLUCOSEU NEGATIVE 04/29/2010 2242  HGBUR TRACE (A) 04/29/2010 2242   BILIRUBINUR NEGATIVE 04/29/2010 2242   KETONESUR NEGATIVE 04/29/2010 2242   PROTEINUR NEGATIVE 04/29/2010 2242   UROBILINOGEN 0.2 04/29/2010 2242   NITRITE NEGATIVE 04/29/2010 2242   LEUKOCYTESUR SMALL (A) 04/29/2010 2242   Sepsis Labs Invalid input(s): PROCALCITONIN,  WBC,  LACTICIDVEN Microbiology No results found for this or any previous visit (from the past 240 hour(s)).   Time coordinating discharge: Over 30 minutes  SIGNED:   Marinda Elk, MD  [facility] 10/12/2016,  9:40 AM Pager   If 7PM-7AM, please contact night-coverage [email or web address] Password [email or web address]

## 2016-10-12 NOTE — Progress Notes (Addendum)
Discharge instruction reviewed with patient. RXs given. Pt instructed not to drive unless seen by neurologist outpatient. All questions answered at this time. Awaiting for transport.  Home medication picked up from pharmacy and returned to patient.   Samanvi Cuccia, RN.

## 2016-10-12 NOTE — Evaluation (Signed)
Occupational Therapy Evaluation Patient Details Name: Katie Douglas MRN: [id number] DOB: 11/09/1986 Today's Date: 10/12/2016    History of Present Illness 30 year old female presenting with new onset of dizziness/vertigo/nausea/vomiting. Patient noted the symptoms appear the day after starting Keppra from a different manufacturer. She denies any recent flu's, head trauma, falls, neck trauma, or neck pain. MRI of the brain is negative however there is a comment about the right vertebral artery being smaller than usual. CT angiogram negative for Rt vertebral artery dissection.    Clinical Impression   Patient evaluated by Occupational Therapy with no further acute OT needs identified. All education has been completed and the patient has no further questions. See below for any follow-up Occupational Therapy or equipment needs. OT to sign off. Thank you for referral.      Follow Up Recommendations  No OT follow up    Equipment Recommendations  None recommended by OT    Recommendations for Other Services       Precautions / Restrictions Precautions Precautions: Fall      Mobility Bed Mobility               General bed mobility comments: in chair on arrival  Transfers Overall transfer level: Modified independent                    Balance                                            ADL Overall ADL's : Independent                                             Vision     Perception     Praxis      Pertinent Vitals/Pain Pain Assessment: No/denies pain     Hand Dominance Right   Extremity/Trunk Assessment Upper Extremity Assessment Upper Extremity Assessment: Overall WFL for tasks assessed   Lower Extremity Assessment Lower Extremity Assessment: Defer to PT evaluation   Cervical / Trunk Assessment Cervical / Trunk Assessment: Normal   Communication Communication Communication: No difficulties    Cognition Arousal/Alertness: Awake/alert Behavior During Therapy: WFL for tasks assessed/performed Overall Cognitive Status: Within Functional Limits for tasks assessed                     General Comments       Exercises Exercises: Other exercises Other Exercises Other Exercises: good return demonstration of visual stabilization   Shoulder Instructions      Home Living Family/patient expects to be discharged to:: Private residence Living Arrangements: Children Available Help at Discharge: Friend(s);Available PRN/intermittently               Bathroom Shower/Tub: Chief Strategy OfficerTub/shower unit   Bathroom Toilet: Standard                Prior Functioning/Environment Level of Independence: Independent        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        OT Problem List:     OT Treatment/Interventions:      OT Goals(Current goals can be found in the care plan section) Acute Rehab OT Goals Patient Stated Goal: stop dizziness so she can care for her daughter and go  to work  OT Frequency:     Barriers to D/C:            Co-evaluation              End of Session Nurse Communication: Mobility status;Precautions  Activity Tolerance: Patient tolerated treatment well Patient left: in chair;with call bell/phone within reach   Time: 1007-1020 OT Time Calculation (min): 13 min Charges:  OT General Charges $OT Visit: 1 Procedure OT Evaluation $OT Eval Low Complexity: 1 Procedure G-Codes: OT G-codes **NOT FOR INPATIENT CLASS** Functional Assessment Tool Used: clincial judgement  Functional Limitation: Self care Self Care Current Status (Z6109(G8987): 0 percent impaired, limited or restricted Self Care Goal Status (U0454(G8988): 0 percent impaired, limited or restricted Self Care Discharge Status (U9811(G8989): 0 percent impaired, limited or restricted  Boone MasterJones, Katie B 10/12/2016, 1:04 PM   [name], Katie   OTR/L Pager: [phone] Office: [phone] .

## 2016-10-18 ENCOUNTER — Telehealth: Payer: Self-pay | Admitting: Neurology

## 2016-10-18 ENCOUNTER — Encounter: Payer: Self-pay | Admitting: Neurology

## 2016-10-18 ENCOUNTER — Institutional Professional Consult (permissible substitution): Payer: Self-pay | Admitting: Medical

## 2016-10-18 ENCOUNTER — Ambulatory Visit (INDEPENDENT_AMBULATORY_CARE_PROVIDER_SITE_OTHER): Payer: BLUE CROSS/BLUE SHIELD | Admitting: Neurology

## 2016-10-18 VITALS — BP 137/87 | HR 77 | Resp 20 | Ht 62.0 in | Wt 178.0 lb

## 2016-10-18 DIAGNOSIS — G40409 Other generalized epilepsy and epileptic syndromes, not intractable, without status epilepticus: Secondary | ICD-10-CM | POA: Diagnosis not present

## 2016-10-18 DIAGNOSIS — H8112 Benign paroxysmal vertigo, left ear: Secondary | ICD-10-CM

## 2016-10-18 MED ORDER — LEVETIRACETAM 500 MG PO TABS
500.0000 mg | ORAL_TABLET | Freq: Two times a day (BID) | ORAL | 5 refills | Status: DC
Start: 2016-10-18 — End: 2017-01-05

## 2016-10-18 MED ORDER — LEVETIRACETAM ER 500 MG PO TB24
1000.0000 mg | ORAL_TABLET | Freq: Every day | ORAL | 5 refills | Status: DC
Start: 2016-10-18 — End: 2016-10-18

## 2016-10-18 MED ORDER — LEVETIRACETAM ER 500 MG PO TB24: 1000.0000 mg | ORAL_TABLET | Freq: Every day | ORAL | 5 refills | Status: DC

## 2016-10-18 MED ORDER — LEVETIRACETAM ER 500 MG PO TB24: 1000.0000 mg | ORAL_TABLET | Freq: Two times a day (BID) | ORAL | 5 refills | Status: DC

## 2016-10-18 NOTE — Telephone Encounter (Signed)
Patient was just seen in our office and a Rx for levETIRAcetam (KEPPRA XR) 500 MG 24 hr [facility] [address] but the patient would like the Rx for generic levETIRAcetam (KEPPRA XR) 500 MG 24 hr tablet [facility] where she can get it cheaper.

## 2016-10-18 NOTE — Addendum Note (Signed)
Addended by: Melvyn NovasHMEIER, Shakenna Herrero on: 10/18/2016 04:38 PM   Modules accepted: Orders

## 2016-10-18 NOTE — Telephone Encounter (Signed)
Pt has already been informed that Dr. Vickey Hugerohmeier has changed the levetiracetam from XR to normal release.

## 2016-10-18 NOTE — Progress Notes (Signed)
Provider:  Melvyn Novasarmen  Kathaleya Douglas, M D  Referring Provider: No ref. provider found Primary Care Physician:  No PCP Per Patient  Chief Complaint  Patient presents with  . New Patient (Initial Visit)    has seizures, on keppra, makes her not feel well, wants [employer] manufacturer of keppra, not driving    HPI:  Katie Douglas is a 30 y.o. female  Is seen here as a referral  from Dr. David StallFeliz Douglas,  Katie Douglas is a 30 year old right-handed African-American female that presented on November [facility]. She has a history of seizure activity and has been treated with Keppra. When she presented to the emergency room for chief complaint was acute vertigo. During her emergency room visit a CT angiogram of head and neck was performed which showed no dissection there was no stroke and she was started on a benzodiazepine, meclizine and vestibular rehabilitation was consulted. Physical therapy however evaluated the patient and recommended no intervention as her symptoms had resolved by the time they came to see her. She was asked to follow up with us in neurology as an outpatient. She was also asked not to drive or operate machinery on her new 2 medications. Her seizure medications were refilled but not changed. She cannot drive until April [id number].  [name]. Her last seizure was on October 7th of this year, prior to her vertigo presentation and apparently unrelated. Seizures begun 2 years ago, of unknown origin. usually appear to be tonic, she does not seem to convulse just to become very stiff and verbally unresponsive, she is also described as being postictally confused and combative. She is unsure about the duration of her seizures. She does not have them frequently. She suffered her first seizure, was evaluated at in [place] but not placed on medication as this had been a single event. 2 months later she had another spell and now was placed on medication. She was worked up with an EEG,  had an imaging study, but I do not have the reports available. A total of 4 seizures thus far.   The patient reports that she was changed to a generic form of Keppra, actually between to generics to a different form. [place] at [facility]'s Keppra made by [employer], 500 mg, of which she took  1 tab twice a day, she now changde to [facility]aid and the medication looks different but is described as levetiracetam 500 mg, she is concerned that this new generic has not had the same seizure protective value. She was able to operate machinery and felt good on her previous generic medication, the first day she took the new medication she was unable to coordinate as well was drowsy and felt impaired.  Review of Systems: Out of a complete 14 system review, the patient complains of only the following symptoms, and all other reviewed systems are negative. Dizzy, off balance, higher fall risk, nauseated, every time she stood up she was nauseated and actually vomited while having this dizzy spell. It has now resolved  Social History   Social History  . Marital status: Single    Spouse name: N/A  . Number of children: N/A  . Years of education: N/A   Occupational History  . Not on file.   Social History Main Topics  . Smoking status: Former Smoker    Quit date: 04/25/2012  . Smokeless tobacco: Never Used  . Alcohol use  Yes     Comment: socially  . Drug use:     Frequency: 1.0 time per week    Types: Marijuana  . Sexual activity: Not on file   Other Topics Concern  . Not on file   Social History Narrative  . No narrative on file    No family history on file.  Past Medical History:  Diagnosis Date  . Seizures (HCC)     Past Surgical History:  Procedure Laterality Date  . CESAREAN SECTION N/A 2013    Current Outpatient Prescriptions  Medication Sig Dispense Refill  . levETIRAcetam (KEPPRA) 500 MG tablet Take 1 tablet (500 mg total) by mouth 2 (two)  times daily. 60 tablet 0  . meclizine (ANTIVERT) 25 MG tablet Take 1 tablet (25 mg total) by mouth 3 (three) times daily. 3 times a day for the next 3 days then take it 3 times a day as needed. 30 tablet 0   No current facility-administered medications for this visit.     Allergies as of 10/18/2016  . (No Known Allergies)    Vitals: BP 137/87   Pulse 77   Resp 20   Ht 5\' 2"  (1.575 m)   Wt 178 lb (80.7 kg)   LMP 09/27/2016 (Exact Date)   BMI 32.56 kg/m  Last Weight:  Wt Readings from Last 1 Encounters:  10/18/16 178 lb (80.7 kg)   Last Height:   Ht Readings from Last 1 Encounters:  10/18/16 5\' 2"  (1.575 m)    Physical exam:  General: The patient is awake, alert and appears not in acute distress. The patient is well groomed. Head: Normocephalic, atraumatic. Neck is supple. Mallampati 2, neck circumference 15 inches, Cardiovascular:  Regular rate and rhythm , without  murmurs or carotid bruit, and without distended neck veins. No evidence of tongue bite.  Respiratory: Lungs are clear to auscultation. Skin:  Without evidence of edema, or rash Trunk: BMI is  elevated and patient  has normal posture.  Neurologic exam : The patient is awake and alert, oriented to place and time.   Memory subjective  described as intact. There is a normal attention span & concentration ability. Speech is fluent without dysarthria, dysphonia or aphasia. Mood and affect are appropriate.  Cranial nerves: Pupils are equal and briskly reactive to light. Funduscopic exam without evidence of pallor or edema.  Extraocular movements  in vertical and horizontal planes intact and without nystagmus. She reports blurred vision.  Visual fields by finger perimetry are intact. Hearing to finger rub intact.  Facial sensation intact to fine touch. Facial motor strength is symmetric and tongue and uvula move midline. Tongue protrusion into either cheek is normal. Shoulder shrug is normal.   Motor exam: Normal  tone ,muscle bulk and symmetric  strength in all extremities. Sensory:  Fine touch, pinprick and vibration were tested in all extremities. Proprioception was normal. Coordination: Rapid alternating movements in the fingers/hands were normal. Finger-to-nose maneuver normal without evidence of ataxia, dysmetria or tremor. Gait and station: Patient walks without assistive device and is able unassisted to climb up to the exam table. Strength within normal limits. Stance is stable and normal. Tandem gait is unfragmented. Romberg testing is negative   Deep tendon reflexes: in the  upper and lower extremities are symmetric and intact. Babinski maneuver response is downgoing.  CLINICAL DATA:  30 year old female with sudden onset of nausea and dizziness. Possible medication reaction. On Keppra for seizure for the past year. Initial encounter.  EXAM:  MRI HEAD WITHOUT CONTRAST  TECHNIQUE: Multiplanar, multiecho pulse sequences of the brain and surrounding structures were obtained without intravenous contrast.  COMPARISON:  None.  FINDINGS: Brain: No acute infarct or intracranial hemorrhage.  No atrophy or hydrocephalus.  No intracranial mass lesion noted on this unenhanced exam.  Partially empty minimally prominent size sella without secondary findings of pseudotumor.  High-resolution imaging not performed through the hippocampus however, no evidence of mesial temporal sclerosis.  Vascular: Right vertebral artery is diminutive in size possibly ending in a posterior inferior cerebellar artery distribution. Remainder major intracranial vascular structures are patent.  Skull and upper cervical spine: Negative.  Sinuses/Orbits: No acute orbital abnormality.  Inferior right maxillary sinus 1.2 cm structure may represent a retention cyst.  Other: Top-normal size upper neck lymph nodes of indeterminate significance.  IMPRESSION: No acute infarct or intracranial  hemorrhage.  No intracranial mass lesion noted on this unenhanced exam.  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 However if the patient had right-sided neck pain raising possibility of right vertebral artery dissection than CT angiogram or MR angiogram could be obtained for further delineation.   Electronically Signed   By: Lacy Duverney M.D.   On: 10/10/2016 12:23    Assessment:  After physical and neurologic examination, review of laboratory studies, imaging, neurophysiology testing and pre-existing records, assessment is that of :   Katie Douglas suffers from a seizure disorder of unknown origin. I would like to get reports of her previous EEG and imaging studies from [place] and will ask her to sign a release form for [place]..  She reports side effects, and seizure breakthrough activity after she changed from [employer] of Keppra to another. She was drowsy, dizzy and has not felt well. She would either like to return to her previous generic maker but I would prefer for her to actually use brand name only Keppra so that in the future she will not experience being changed to a different manufacturer without the doctor's knowledge. For this reason I will place her on Keppra brand name only . She has been on only 500 mg twice a day a total of 1000 mg, but did not seize while taking this medication previously manufactured by [employer]in.  The severe dizziness spell is clearly defined as vertigo she felt off balance any movement exacerbates it and and she became nauseated, even vomited. Vertigo spells can occur with some seizure types 2, but the patient had not experienced vertigo before so I think this is a separate entity.  I agree with the mechanism and benzodiazepine use to suppress vertigo, but would  recommend vestibular rehabilitation. She was instructed not to take meclizine or benzodiazepines prior to vestibular rehabilitation appointments, as not to suppress the symptoms. I could not identify nystagmus today.  Plan:  Treatment plan and additional workup :  Vestibular rehab. EEG Keppra 500 mg bid brand name only.  Rv in 5-6 weeks with Np, follow up on seizures and vertigo.    Porfirio Mylar Quinisha Mould MD 10/18/2016

## 2016-10-18 NOTE — Telephone Encounter (Signed)
Changed to LUPIN. 500 mg bid po.

## 2016-10-18 NOTE — Telephone Encounter (Signed)
I called pt. She wants to take generic keppra [facility] because they have the Lupin manufacturer that she has taken in the past. Pt says that she has taken generic keppra manufactured by Lupin and it worked well for her. [facility] [address]. Pt verbalized understanding.

## 2016-10-18 NOTE — Patient Instructions (Signed)
    Epilepsy Epilepsy is when a person keeps having seizures. A seizure is unusual activity in the brain. A seizure can change how you think or behave, and it can make it hard to be aware of what is happening. This condition can cause problems, such as:  Falls, accidents, and injury.  Depression.  Poor memory.  Sudden unexplained death in epilepsy (SUDEP). This is rare. Its cause is not known. Most people with epilepsy lead normal lives. Follow these instructions at home: Medicines  Take medicines only as told by your doctor.  Avoid anything that may keep your medicine from working, such as alcohol. Activity  Get enough rest. Lack of sleep can make seizures more likely to occur.  Follow your doctor's advice about driving, swimming, and doing anything else that would be dangerous if you had a seizure. Teaching others  Teach friends and family what to do if you have a seizure. They should:  Lay you on the ground to prevent a fall.  Cushion your head and body.  Loosen any tight clothing around your neck.  Turn you on your side.  Stay with you until you are better.  Not hold you down.  Not put anything in your mouth.  Know whether or not you need emergency care. General instructions  Avoid anything that causes you to have seizures.  Keep a seizure diary. Write down what you remember about each seizure, and especially what might have caused it.  Keep all follow-up visits as told by your doctor. This is important. Contact a doctor if:  You have a change in your seizure pattern.  You get an infection or start to feel sick. You may have more seizures when you are sick. Get help right away if:  A seizure does not stop after 5 minutes.  You have more than one seizure in a row, and you do not have enough time between the seizures to feel better.  A seizure makes it harder to breathe.  A seizure is different from other seizures you have had.  A seizure makes you  unable to speak or use a part of your body.  You did not wake up right after a seizure. This information is not intended to replace advice given to you by your health care provider. Make sure you discuss any questions you have with your health care provider. Document Released: 09/04/2009 Document Revised: 06/13/2016 Document Reviewed: 05/17/2016 Elsevier Interactive Patient Education  2017 [employer]  

## 2016-10-18 NOTE — Telephone Encounter (Signed)
I spoke to pt and advised her that Dr. Vickey Hugerohmeier has changed the levetiracetam RX from XR to regular formulation. PT should take one tablet BID. Pt says that she already picked up the medication from her pharmacy with the XR strength. I advised her that next time, she will gave just the regular tablet. (Not XR). Pt verbalized understanding.

## 2016-10-18 NOTE — Addendum Note (Signed)
Addended by: Geronimo RunningINKINS, Khaila Velarde A on: 10/18/2016 01:17 PM   Modules accepted: Orders

## 2016-10-19 ENCOUNTER — Encounter: Payer: Self-pay | Admitting: Medical

## 2016-10-19 ENCOUNTER — Ambulatory Visit (INDEPENDENT_AMBULATORY_CARE_PROVIDER_SITE_OTHER): Payer: BLUE CROSS/BLUE SHIELD | Admitting: Medical

## 2016-10-19 VITALS — BP 122/70 | HR 83 | Ht 64.0 in | Wt 181.0 lb

## 2016-10-19 DIAGNOSIS — Z124 Encounter for screening for malignant neoplasm of cervix: Secondary | ICD-10-CM | POA: Diagnosis not present

## 2016-10-19 DIAGNOSIS — Z113 Encounter for screening for infections with a predominantly sexual mode of transmission: Secondary | ICD-10-CM | POA: Diagnosis not present

## 2016-10-19 DIAGNOSIS — N63 Unspecified lump in unspecified breast: Secondary | ICD-10-CM | POA: Diagnosis not present

## 2016-10-19 DIAGNOSIS — Z30019 Encounter for initial prescription of contraceptives, unspecified: Secondary | ICD-10-CM | POA: Diagnosis not present

## 2016-10-19 DIAGNOSIS — R569 Unspecified convulsions: Secondary | ICD-10-CM

## 2016-10-19 DIAGNOSIS — Z0000 Encounter for general adult medical examination without abnormal findings: Secondary | ICD-10-CM | POA: Diagnosis not present

## 2016-10-19 DIAGNOSIS — Z7189 Other specified counseling: Secondary | ICD-10-CM | POA: Insufficient documentation

## 2016-10-19 NOTE — Progress Notes (Signed)
Subjective:   HPI  Katie Douglas is a 30 y.o. female who presents for a complete physical.  New patient today.  Moved to [place] in June 2017, moved from [place]. Parents live in [place], siblings live in [place].  She lives here with her 31yo daughter.    She established care with neurology yesterday, was restarted on name brand Levetiracetam 500mg  BID.  She apparently didn't do well with generic Keppra in the past, had recently hospitalization for dizziness/medication adverse effect, but this was really though to be vertigo.   She is past due for pap, has hx/o breast lump for many years in left breast, but this hasn't been checked out in a while.    Otherwise no new c/o.  She is on her period currently.  She is not currently sexually active.   She is interested in getting back on OCPs. She was on OCP prior, didn't do well with patch or Nuvaring.   Not interested in other hormonal types such as Nexplanon or IUD.    No other aggravating or relieving factors. No other complaint.  Reviewed their medical, surgical, family, social, medication, and allergy history and updated chart as appropriate.  Past Medical History:  Diagnosis Date  . Contraception management    prefers OCP.  likes monthly period to come.   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 Pregnancy with history of miscarriage    related to Group B strep, twice  . Seizure (HCC) 2015   established with Dr. Vickey Hugerohmeier 09/2016  . Wears contact lenses     Past Surgical History:  Procedure Laterality Date  . CERVICAL CERCLAGE  2009  . CESAREAN SECTION N/A 2013    Social History   Social History  . Marital status: Single    Spouse name: N/A  . Number of children: N/A  . Years of education: N/A   Occupational History  . Not on file.   Social History Main Topics  . Smoking status: Former Smoker    Quit date: 04/25/2012  . Smokeless tobacco: Never Used  . Alcohol use 1.8 oz/week    3  Shots of liquor per week     Comment: socially  . Drug use:     Frequency: 1.0 time per week    Types: Marijuana  . Sexual activity: Not on file   Other Topics Concern  . Not on file   Social History Narrative   Lives with her daughter, 614yo.   Works at [employer] and [employer], Clinical biochemistcustomer service.   Exercises.  Eats relatively healthy. Rest of family [place] in [place].  09/2016.      Family History  Problem Relation Age of Onset  . Sarcoidosis Mother   . Diabetes Father   . Hypertension Father   . Cancer Paternal Grandmother   . Cancer Paternal Grandfather     prostate cancer  . Diabetes Paternal Grandfather   . Hypertension Paternal Grandfather      Current Outpatient Prescriptions:  .  levETIRAcetam (KEPPRA) 500 MG tablet, Take 1 tablet (500 mg total) by mouth 2 (two) times daily., Disp: 60 tablet, Rfl: 5 .  meclizine (ANTIVERT) 25 MG tablet, Take 1 tablet (25 mg total) by mouth 3 (three) times daily. 3 times a day for the next 3 days then take it 3 times a day as needed. (Patient not taking: Reported on 10/19/2016), Disp: 30 tablet, Rfl: 0  Allergies  Allergen  Reactions  . Nuvaring [Etonogestrel-Ethinyl Estradiol]     Had odor with this      Review of Systems Constitutional: -fever, -chills, -sweats, -unexpected weight change, -decreased appetite, -fatigue Allergy: -sneezing, -itching, -congestion Dermatology: -changing moles, --rash, -lumps ENT: -runny nose, -ear pain, -sore throat, -hoarseness, -sinus pain, -teeth pain, - ringing in ears, -hearing loss, -nosebleeds Cardiology: -chest pain, -palpitations, -swelling, -difficulty breathing when lying flat, -waking up short of breath Respiratory: -cough, -shortness of breath, -difficulty breathing with exercise or exertion, -wheezing, -coughing up blood Gastroenterology: -abdominal pain, -nausea, -vomiting, -diarrhea, -constipation, -blood in stool, -changes in bowel movement, -difficulty swallowing or  eating Hematology: -bleeding, -bruising  Musculoskeletal: -joint aches, -muscle aches, -joint swelling, -back pain, -neck pain, -cramping, -changes in gait Ophthalmology: denies vision changes, eye redness, itching, discharge Urology: -burning with urination, -difficulty urinating, -blood in urine, -urinary frequency, -urgency, -incontinence Neurology: -headache, -weakness, -tingling, -numbness, -memory loss, -falls, -dizziness Psychology: -depressed mood, -agitation, -sleep problems     Objective:   Physical Exam  BP 122/70   Pulse 83   Ht 5\' 4"  (1.626 m)   Wt 181 lb (82.1 kg)   LMP 10/18/2016   SpO2 98%   BMI 31.07 kg/m   General appearance: alert, no distress, WD/WN, AA female Skin: few scattered macules, no worrisome lesions HEENT: normocephalic, conjunctiva/corneas normal, sclerae anicteric, PERRLA, EOMi, nares patent, no discharge or erythema, pharynx normal Oral cavity: MMM, tongue normal, teeth in good repair Neck: supple, no lymphadenopathy, no thyromegaly, no masses, normal ROM, no bruits Chest: non tender, normal shape and expansion Heart: RRR, normal S1, S2, no murmurs Lungs: CTA bilaterally, no wheezes, rhonchi, or rales Abdomen: +bs, soft, non tender, non distended, no masses, no hepatomegaly, no splenomegaly, no bruits Back: non tender, normal ROM, no scoliosis Musculoskeletal: upper extremities non tender, no obvious deformity, normal ROM throughout, lower extremities non tender, no obvious deformity, normal ROM throughout Extremities: no edema, no cyanosis, no clubbing Pulses: 2+ symmetric, upper and lower extremities, normal cap refill Neurological: alert, oriented x 3, CN2-12 intact, strength normal upper extremities and lower extremities, sensation normal throughout, DTRs 2+ throughout, no cerebellar signs, gait normal Psychiatric: normal affect, behavior normal, pleasant  Breast/gyn/rectal - deferred to next week    Assessment and Plan :    Encounter  Diagnoses  Name Primary?  . Encounter for health maintenance examination in adult Yes  . Breast lump   . Screen for STD (sexually transmitted disease)   . Screening for cervical cancer   . Encounter for initial prescription of contraceptives, unspecified contraceptive   . Seizures (HCC)     Physical exam - discussed healthy lifestyle, diet, exercise, preventative care, vaccinations, and addressed their concerns.   cousneled on vaccines. See your eye doctor yearly for routine vision care. See your dentist yearly for routine dental care including hygiene visits twice yearly. Breast lump - breast exam next week when she returns Pap next week.  Will plan to start OCP when she returns for urine pregnancy, pap, exam. Seizures- reviewed neurology notes from yesterday, she has f/u planned in a few weeks there. Follow-up next week fasting for labs, breast/pelvic exam, pap.

## 2016-10-27 ENCOUNTER — Ambulatory Visit: Payer: BLUE CROSS/BLUE SHIELD | Attending: Neurology | Admitting: Physical Therapy

## 2016-10-27 DIAGNOSIS — R42 Dizziness and giddiness: Secondary | ICD-10-CM | POA: Diagnosis not present

## 2016-10-28 NOTE — Therapy (Signed)
[facility] [address] [place], [place], [address] Phone: [phone]   Fax:  [phone]  Physical Therapy Evaluation  Patient Details  Name: Katie Douglas MRN: [id number] Date of Birth: 09/06/1986 Referring Provider: Dr. Porfirio Mylararmen Dohmeier  Encounter Date: 10/27/2016      PT End of Session - 10/28/16 1048    Visit Number 1   Number of Visits 1   Authorization Type BCBS   PT Start Time 1402   PT Stop Time 1446   PT Time Calculation (min) 44 min      Past Medical History:  Diagnosis Date  . Contraception management    prefers OCP.  likes monthly period to come.   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 Pregnancy with history of miscarriage    related to Group B strep, twice  . Seizure (HCC) 2015   established with Dr. Vickey Hugerohmeier 09/2016  . Wears contact lenses     Past Surgical History:  Procedure Laterality Date  . CERVICAL CERCLAGE  2009  . CESAREAN SECTION N/A 2013    There were no vitals filed for this visit.       Subjective Assessment - 10/28/16 1041    Subjective Pt reports vertigo has almost completely resolved but not fully - reports she will get a little dizzy with quick turns and with riding in a car   Patient Stated Goals wants to get back 100%            [facility] PT Assessment - 10/28/16 0001      Assessment   Medical Diagnosis Vertigo   Referring Provider Dr. Porfirio Mylararmen Dohmeier   Onset Date/Surgical Date 10/10/16     Balance Screen   Has the patient fallen in the past 6 months No   Has the patient had a decrease in activity level because of a fear of falling?  No     Home Environment   Living Environment Private residence   Type of Home House   Home Access Stairs to enter   Entrance Stairs-Number of Steps 4   Entrance Stairs-Rails Right   Home Layout One level     Prior Function   Level of Independence Independent             Vestibular Assessment - 10/28/16 0001      Vestibular Assessment   General Observation Pt reports no vertigo at time of initial eval - states it has almost fully resolved but not completely     Symptom Behavior   Type of Dizziness Unsteady with head/body turns   Frequency of Dizziness varies   Duration of Dizziness seconds only now   Aggravating Factors Comment  quick turns and riding in car at times   Relieving Factors Slow movements     Occulomotor Exam   Occulomotor Alignment Normal     Visual Acuity   Static line 7   Dynamic line 5  WNL's but c/o dizziness (mild) after testing                       PT Education - 10/28/16 1047    Education provided Yes   Education Details results of eval -HEP for vestibular input   Person(s) Educated Patient   Methods Explanation;Demonstration;Handout   Comprehension Verbalized understanding;Returned demonstration                    Plan -  10/28/16 1048    Clinical Impression Statement Pt is a 30 year old female who was [facility] 10[facility]16 with c/o vertigo; discharged home on 10-12-16; pt states vertigo has almost fully resolved at this time but continues to have some mild vertigo with quick turns and with rididng in car; pt attributes etiology of vertigo to side effect of new medication she started taking at time of onset; states medication has been changed and she is continuing to do gaze stabilization exercise given to her by PT in hospital    Rehab Potential Good   PT Frequency 1x / week   PT Duration --  eval only   PT Treatment/Interventions Patient/family education;Neuromuscular re-education;ADLs/Self Care Home Management   PT Next Visit Plan N/A - eval only   PT Home Exercise Plan balance on foam and gaze stabilization on patterned background   Consulted and Agree with Plan of Care Patient      Patient will benefit from skilled therapeutic intervention in order to improve  the following deficits and impairments:  Dizziness  Visit Diagnosis: Dizziness and giddiness - Plan: PT plan of care cert/re-cert     Problem List Patient Active Problem List   Diagnosis Date Noted  . Encounter for health maintenance examination in adult 10/19/2016  . Breast lump 10/19/2016  . Screen for STD (sexually transmitted disease) 10/19/2016  . Screening for cervical cancer 10/19/2016  . Encounter for initial prescription of contraceptives 10/19/2016  . Epilepsy symptomatic, generalized (HCC) 10/18/2016  . Benign paroxysmal positional vertigo of left ear 10/18/2016  . Vertigo 10/10/2016  . Seizures (HCC) 10/10/2016    Neema Fluegge, Donavan BurnetLinda Suzanne, PT 10/28/2016, 10:56 AM  [facility] [address] [place], [place], [address] Phone: [phone]   Fax:  [phone]  Name: Katie SpinnerJessica A Douglas MRN: [id number] Date of Birth: 12/10/1985

## 2016-10-28 NOTE — Patient Instructions (Signed)
Gaze Stabilization: Standing Feet Apart (Compliant Surface)    Feet apart on pillow, keeping eyes on target on wall _5___ feet away, tilt head down 15-30 and move head side to side for __60__ seconds. Repeat while moving head up and down for _60___ seconds. Do _3___ sessions per day. Repeat using target on pattern background. Feet Apart (Compliant Surface) Varied Arm Positions - Eyes Open   With eyes open, standing on compliant surface: PILLOW, feet shoulder width apart and arms AT YOUR SIDE, look at a stationary object. Hold  30-45 seconds. Repeat  3  times per session. Do 1-2 sessions per day.  Copyright  VHI. All rights reserved.  Feet Together (Compliant Surface) Varied Arm Positions - Eyes Open   With eyes open, standing on compliant surface: PILLOW, feet together and arms AT YOUR SIDE, look at a stationary object. Hold 30-45 seconds. Repeat 3 times per session. Do 1-2 sessions per day.  Copyright  VHI. All rights reserved.    Feet Apart (Compliant Surface) Varied Arm Positions - Eyes Closed   Stand on compliant surface: PILLOW with feet shoulder width apart and arms AT YOUR SIDE. Close eyes and visualize upright position. Hold 30-45 seconds. Repeat 3 times per session. Do 1-2 sessions per day.  Copyright  VHI. All rights reserved.    Feet Together (Compliant Surface) Varied Arm Positions - Eyes Closed   Stand on compliant surface: PILLOW with feet together and arms out. Close eyes and visualize upright position. Hold 30-45 seconds. Repeat 3 times per session. Do 1-2 sessions per day.  Copyright  VHI. All rights reserved.    Copyright  VHI. All rights reserved.

## 2016-11-02 ENCOUNTER — Other Ambulatory Visit (HOSPITAL_COMMUNITY)
Admission: RE | Admit: 2016-11-02 | Discharge: 2016-11-02 | Disposition: A | Discharge status: Home / Self Care | Payer: BLUE CROSS/BLUE SHIELD | Source: Ambulatory Visit | Attending: Medical | Admitting: Medical

## 2016-11-02 ENCOUNTER — Ambulatory Visit (INDEPENDENT_AMBULATORY_CARE_PROVIDER_SITE_OTHER): Payer: BLUE CROSS/BLUE SHIELD | Admitting: Medical

## 2016-11-02 ENCOUNTER — Encounter: Payer: Self-pay | Admitting: Medical

## 2016-11-02 VITALS — BP 122/60 | HR 59 | Wt 177.6 lb

## 2016-11-02 DIAGNOSIS — Z113 Encounter for screening for infections with a predominantly sexual mode of transmission: Secondary | ICD-10-CM

## 2016-11-02 DIAGNOSIS — Z124 Encounter for screening for malignant neoplasm of cervix: Secondary | ICD-10-CM | POA: Diagnosis not present

## 2016-11-02 DIAGNOSIS — A6000 Herpesviral infection of urogenital system, unspecified: Secondary | ICD-10-CM | POA: Diagnosis not present

## 2016-11-02 DIAGNOSIS — Z30019 Encounter for initial prescription of contraceptives, unspecified: Secondary | ICD-10-CM

## 2016-11-02 DIAGNOSIS — Z01419 Encounter for gynecological examination (general) (routine) without abnormal findings: Secondary | ICD-10-CM | POA: Insufficient documentation

## 2016-11-02 DIAGNOSIS — H8112 Benign paroxysmal vertigo, left ear: Secondary | ICD-10-CM | POA: Diagnosis not present

## 2016-11-02 DIAGNOSIS — Z23 Encounter for immunization: Secondary | ICD-10-CM

## 2016-11-02 DIAGNOSIS — Z0000 Encounter for general adult medical examination without abnormal findings: Secondary | ICD-10-CM

## 2016-11-02 DIAGNOSIS — G40409 Other generalized epilepsy and epileptic syndromes, not intractable, without status epilepticus: Secondary | ICD-10-CM | POA: Diagnosis not present

## 2016-11-02 DIAGNOSIS — Z1151 Encounter for screening for human papillomavirus (HPV): Secondary | ICD-10-CM | POA: Diagnosis not present

## 2016-11-02 LAB — POCT URINALYSIS DIPSTICK
BILIRUBIN UA: NEGATIVE
Blood, UA: NEGATIVE
Glucose, UA: NEGATIVE
KETONES UA: NEGATIVE
LEUKOCYTES UA: NEGATIVE
Nitrite, UA: NEGATIVE
PROTEIN UA: NEGATIVE
Spec Grav, UA: 1.02
Urobilinogen, UA: NEGATIVE
pH, UA: 8

## 2016-11-02 LAB — POCT URINE PREGNANCY: PREG TEST UR: NEGATIVE

## 2016-11-02 LAB — TSH: TSH: 0.89 m[IU]/L

## 2016-11-02 MED ORDER — NORGESTIM-ETH ESTRAD TRIPHASIC 0.18/0.215/0.25 MG-35 MCG PO TABS: 1.0000 | ORAL_TABLET | Freq: Every day | ORAL | 11 refills | Status: DC

## 2016-11-02 NOTE — Progress Notes (Signed)
Subjective: Chief Complaint  Patient presents with  . pap, lab work    pap, lab work    I saw her as a new patient on 10/19/16 for physical.   She is here today for breast/ pelvic exam since she was menstruating that day, LMP 10/19/16.   She is interested in OCPs instead of other forms of contraception.   She is sexually active, same partner, uses condoms. She does have hx/o genital herpes self diagnosed years ago.  She gets cold sores.  She notes the first outbreak in the genital area was after she realized a prior partner had oral sex on her with a cold sore.  He had a mustache at the time.  She has never taken medication for cold sores.   She has hx/o 4 pregnancies, 1 live birth, 2 miscarriages, 1 abortion.  Last pap unknown.   No current vaginal concerns, no concern for STD.    She has long standing lump in left lateral breast unchanged for years.     Here for fasting labs f/u from last visit.  No other aggravating or relieving factors. No other complaint.  Past Medical History:  Diagnosis Date  . Contraception management    prefers OCP.  likes monthly period to come.   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 Pregnancy with history of miscarriage    related to Group B strep, twice  . Seizure (HCC) 2015   established with Dr. Vickey Hugerohmeier 09/2016  . Wears contact lenses    Current Outpatient Prescriptions on File Prior to Visit  Medication Sig Dispense Refill  . levETIRAcetam (KEPPRA) 500 MG tablet Take 1 tablet (500 mg total) by mouth 2 (two) times daily. 60 tablet 5  . meclizine (ANTIVERT) 25 MG tablet Take 1 tablet (25 mg total) by mouth 3 (three) times daily. 3 times a day for the next 3 days then take it 3 times a day as needed. (Patient not taking: Reported on 11/02/2016) 30 tablet 0   No current facility-administered medications on file prior to visit.    ROS as in subjective   Objective: BP 122/60   Pulse (!) 59   Wt 177 lb 9.6 oz (80.6  kg)   LMP 10/18/2016   SpO2 99%   BMI 30.48 kg/m    Breast: nontender, spongy/glandular tissue left lateral breast but no distinct masses or lumps, no skin changes, no nipple discharge or inversion, no axillary lymphadenopathy Gyn: Normal external genitalia without lesions, vagina with normal mucosa, cervix without lesions, no cervical motion tenderness, no abnormal vaginal discharge.  Uterus and adnexa not enlarged, nontender, no masses.  Pap performed.  Exam chaperoned by nurse. Rectal: small external hemorrhoids     Assessment: Encounter Diagnoses  Name Primary?  . Encounter for gynecological examination without abnormal finding Yes  . Screening for cervical cancer   . Routine general medical examination at a health care facility   . Need for Tdap vaccination   . Screen for STD (sexually transmitted disease)   . Encounter for initial prescription of contraceptives, unspecified contraceptive   . Genital herpes simplex, unspecified site   . Epilepsy symptomatic, generalized (HCC)   . Benign paroxysmal positional vertigo of left ear   . Encounter for health maintenance examination in adult     Plan: Labs today as f/u from last visit discussed diagnosis of genital herpes and cold sores, symptoms, signs, means of transmission, prevention.  Consider acute therapy  and suppressive therapy with Valtrex.  She will let me know.  Labs today Counseled on the Tdap (tetanus, diptheria, and acellular pertussis) vaccine.  Vaccine information sheet given. Tdap vaccine given after consent obtained. Labs for STD screen, pap smear today  We discussed her interest in oral hormonal contraception.  Discussed treatment options, types of OCPs, various other methods available.  Discussed non hormonal contraception as well.  We discussed risks of OCPs including headache, nausea, breast tenderness, irregular bleeding or spotting, possible risks of blood clots, heart attack, stroke.     Discussed advantages  of OCPs including possible decreased premenstrual symptoms, improving cramps, regulating cycles, decreasing heavy flow.  Discussed proper use of medication.  She understands the benefits, risks, and wishes to begin OCPs. Urine pregnancy negative.  Discussed safe sex, STD prevention, condom use.    Shanda BumpsJessica was seen today for pap, lab work.  Diagnoses and all orders for this visit:  Encounter for gynecological examination without abnormal finding  Screening for cervical cancer -     Cytology - PAP  Routine general medical examination at a health care facility -     Urinalysis Dipstick -     HSV 1 antibody, IgG -     HSV 2 antibody, IgG -     HSV(herpes simplex vrs) 1+2 ab-IgM -     POCT urine pregnancy  Need for Tdap vaccination  Screen for STD (sexually transmitted disease) -     HSV 1 antibody, IgG -     HSV 2 antibody, IgG -     HSV(herpes simplex vrs) 1+2 ab-IgM -     Cytology - PAP -     HIV antibody -     RPR  Encounter for initial prescription of contraceptives, unspecified contraceptive -     POCT urine pregnancy  Genital herpes simplex, unspecified site -     HSV 1 antibody, IgG -     HSV 2 antibody, IgG -     HSV(herpes simplex vrs) 1+2 ab-IgM  Epilepsy symptomatic, generalized (HCC) -     EEG adult  Benign paroxysmal positional vertigo of left ear -     EEG adult  Encounter for health maintenance examination in adult -     Cytology - PAP -     Lipid panel -     TSH -     Hemoglobin A1c -     HIV antibody -     RPR  Other orders -     Norgestimate-Ethinyl Estradiol Triphasic (ORTHO TRI-CYCLEN, 28,) 0.18/0.215/0.25 MG-35 MCG tablet; Take 1 tablet by mouth daily.

## 2016-11-03 LAB — HSV 2 ANTIBODY, IGG: HSV 2 Glycoprotein G Ab, IgG: 4.26 Index — ABNORMAL HIGH (ref <0.90)

## 2016-11-03 LAB — LIPID PANEL
Cholesterol: 153 mg/dL (ref <200)
HDL: 48 mg/dL — AB (ref >50)
LDL CALC: 94 mg/dL (ref <100)
Total CHOL/HDL Ratio: 3.2 Ratio (ref <5.0)
Triglycerides: 55 mg/dL (ref <150)
VLDL: 11 mg/dL (ref <30)

## 2016-11-03 LAB — HEMOGLOBIN A1C
HEMOGLOBIN A1C: 5.3 % (ref <5.7)
MEAN PLASMA GLUCOSE: 105 mg/dL

## 2016-11-03 LAB — HSV 1 ANTIBODY, IGG: HSV 1 Glycoprotein G Ab, IgG: 25.7 Index — ABNORMAL HIGH (ref <0.90)

## 2016-11-03 LAB — HIV ANTIBODY (ROUTINE TESTING W REFLEX): HIV: NONREACTIVE

## 2016-11-03 LAB — RPR

## 2016-11-04 LAB — CYTOLOGY - PAP
Adequacy: ABSENT
CHLAMYDIA, DNA PROBE: NEGATIVE
Diagnosis: NEGATIVE
HPV (WINDOPATH): NOT DETECTED
NEISSERIA GONORRHEA: NEGATIVE

## 2016-11-05 LAB — HSV 1/2 AB (IGM), IFA W/RFLX TITER
HSV 1 IGM SCREEN: NEGATIVE
HSV 2 IgM Screen: NEGATIVE

## 2016-11-07 ENCOUNTER — Other Ambulatory Visit: Payer: Self-pay | Admitting: Medical

## 2016-11-07 ENCOUNTER — Telehealth: Payer: Self-pay

## 2016-11-07 MED ORDER — VALACYCLOVIR HCL 500 MG PO TABS: ORAL_TABLET | ORAL | 2 refills | Status: DC

## 2016-11-07 NOTE — Telephone Encounter (Signed)
Called and spoke patient

## 2016-11-07 NOTE — Telephone Encounter (Signed)
Pt returned call to you. Please call back

## 2016-12-08 ENCOUNTER — Other Ambulatory Visit: Payer: BLUE CROSS/BLUE SHIELD

## 2016-12-14 ENCOUNTER — Ambulatory Visit (INDEPENDENT_AMBULATORY_CARE_PROVIDER_SITE_OTHER): Payer: BLUE CROSS/BLUE SHIELD | Admitting: Neurology

## 2016-12-14 DIAGNOSIS — G40409 Other generalized epilepsy and epileptic syndromes, not intractable, without status epilepticus: Secondary | ICD-10-CM

## 2016-12-14 DIAGNOSIS — G40309 Generalized idiopathic epilepsy and epileptic syndromes, not intractable, without status epilepticus: Secondary | ICD-10-CM

## 2016-12-15 ENCOUNTER — Ambulatory Visit: Payer: BLUE CROSS/BLUE SHIELD | Admitting: Nurse Practitioner

## 2016-12-23 NOTE — Procedures (Signed)
   [facility]  EEG (ELECTROENCEPHALOGRAM) REPORT   STUDY DATE: 12/14/16 PATIENT NAME: Katie Douglas DOB: 12/28/1985 MRN: [id number]  ORDERING CLINICIAN: Melvyn Novasarmen Dohmeier, MD   TECHNOLOGIST: 31 year old female with seizure disorder. TECHNIQUE: Electroencephalogram was recorded utilizing standard 10-20 system of lead placement and reformatted into average and bipolar montages.  RECORDING TIME: 40 minutes  ACTIVATION: hyperventilation and photic stimulation  CLINICAL INFORMATION: 31 year old female with seizure.  FINDINGS: Background rhythms of 11-12 hertz and 40-50 microvolts. No focal, lateralizing, epileptiform activity or seizures are seen. Patient recorded in the awake and drowsy state. EKG channel shows regular rhythm of 60 minutes.  IMPRESSION:  Normal EEG in the awake and drowsy states.    INTERPRETING PHYSICIAN:  Suanne MarkerVIKRAM R. PENUMALLI, MD Certified in Neurology, Neurophysiology and Neuroimaging  [facility] [address], [address] [place], [place] [address] [phone]

## 2016-12-27 ENCOUNTER — Telehealth: Payer: Self-pay

## 2016-12-27 NOTE — Telephone Encounter (Signed)
-----   Message from Geronimo RunningKristen Dinkins, RN sent at 12/26/2016  1:13 PM EST -----   ----- Message ----- From: Melvyn Novasarmen Dohmeier, MD Sent: 12/26/2016   9:59 AM To: Geronimo RunningKristen Dinkins, RN  Normal EEG -

## 2016-12-27 NOTE — Telephone Encounter (Signed)
I spoke to patient and she is aware of results.   Patient asks if she can be cleared to drive? States her last seizure was 08/27/2016.

## 2016-12-28 NOTE — Telephone Encounter (Signed)
Seizure free time  has to be 6 month

## 2016-12-28 NOTE — Telephone Encounter (Signed)
LM for patient with recommendation below. I left reminder that she has up coming appt with us on 2/15.

## 2017-01-05 ENCOUNTER — Ambulatory Visit (INDEPENDENT_AMBULATORY_CARE_PROVIDER_SITE_OTHER): Payer: BLUE CROSS/BLUE SHIELD | Admitting: Adult Health

## 2017-01-05 ENCOUNTER — Encounter: Payer: Self-pay | Admitting: Adult Health

## 2017-01-05 VITALS — BP 121/86 | HR 63 | Ht 64.0 in | Wt 183.0 lb

## 2017-01-05 DIAGNOSIS — R569 Unspecified convulsions: Secondary | ICD-10-CM

## 2017-01-05 DIAGNOSIS — R42 Dizziness and giddiness: Secondary | ICD-10-CM

## 2017-01-05 MED ORDER — LEVETIRACETAM ER 500 MG PO TB24: 1000.0000 mg | ORAL_TABLET | Freq: Every day | ORAL | 3 refills | Status: DC

## 2017-01-05 NOTE — Progress Notes (Signed)
I agree with the assessment and plan as directed by NP .The patient is known to me .   Katie Chumney, MD  

## 2017-01-05 NOTE — Patient Instructions (Signed)
Continue Keppra XR 500 mg twice a day If your symptoms worsen or you develop new symptoms please let us know.

## 2017-01-05 NOTE — Progress Notes (Signed)
PATIENT: Katie Douglas DOB: 1986-10-27  REASON FOR VISIT: follow up- seizures, vertigo HISTORY FROM: patient  HISTORY OF PRESENT ILLNESS: [name] is a 31 year old. With a history of seizures and vertigo. She returns today for follow-up. She states that she has remained on Keppra XR 500 mg twice a day. She states that she was able to locate a pharmacy with the same manufacturer that she had previously. She states that since then she's not had any additional seizures. Reports that her last seizure was April 7. Denies any changes with her gait or balance. She is able to complete all ADLs independently. She is not operating a motor vehicle. She reports that her vertigo episodes resolved with vestibular rehabilitation. She returns today for an evaluation.  HISTORY 10/18/16: Katie Douglas  Is seen here as a referral  from Dr. David Stall,  Katie Douglas is a 31 year old right-handed African-American Douglas that presented on November 22 to the [facility] emergency room. She has a history of seizure activity and has been treated with Keppra. When she presented to the emergency room for chief complaint was acute vertigo. During her emergency room visit a CT angiogram of head and neck was performed which showed no dissection there was no stroke and she was started on a benzodiazepine, meclizine and vestibular rehabilitation was consulted. Physical therapy however evaluated the patient and recommended no intervention as her symptoms had resolved by the time they came to see her. She was asked to follow up with [place] in neurology as an outpatient. She was also asked not to drive or operate machinery on her new 2 medications. Her seizure medications were refilled but not changed. She cannot drive until April 8295.  [name] Her last seizure was on October 7th of this year, prior to her vertigo presentation and apparently unrelated. Seizures begun 2 years ago, of unknown origin. usually appear  to be tonic, she does not seem to convulse just to become very stiff and verbally unresponsive, she is also described as being postictally confused and combative. She is unsure about the duration of her seizures. She does not have them frequently. She suffered her first seizure, was evaluated at in [place] but not placed on medication as this had been a single event. 2 months later she had another spell and now was placed on medication. She was worked up with an EEG, had an imaging study, but I do not have the reports available. A total of 4 seizures thus far.   The patient reports that she was changed to a generic form of Keppra, actually between to generics to a different form. She had previously in [place] filled at [facility] made by [employer], 500 mg, of which she took  1 tab twice a day, she now changde to right-aid and the medication looks different but is described as levetiracetam 500 mg, she is concerned that this new generic has not had the same seizure protective value. She was able to operate machinery and felt good on her previous generic medication, the first day she took the new medication she was unable to coordinate as well was drowsy and felt impaired.  REVIEW OF SYSTEMS: Out of a complete 14 system review of symptoms, the patient complains only of the following symptoms, and all other reviewed systems are negative.  See history of present illness  ALLERGIES: Allergies  Allergen Reactions  . Nuvaring [Etonogestrel-Ethinyl Estradiol]  Had odor with this    HOME MEDICATIONS: Outpatient Medications Prior to Visit  Medication Sig Dispense Refill  . levETIRAcetam (KEPPRA) 500 MG tablet Take 1 tablet (500 mg total) by mouth 2 (two) times daily. 60 tablet 5  . meclizine (ANTIVERT) 25 MG tablet Take 1 tablet (25 mg total) by mouth 3 (three) times daily. 3 times a day for the next 3 days then take it 3 times a day as needed. 30 tablet 0  .  Norgestimate-Ethinyl Estradiol Triphasic (ORTHO TRI-CYCLEN, 28,) 0.18/0.215/0.25 MG-35 MCG tablet Take 1 tablet by mouth daily. 1 Package 11  . valACYclovir (VALTREX) 500 MG tablet 1 tablet q12 hours for 3 days for flare up (6 tablets per flare) (Patient not taking: Reported on 01/05/2017) 12 tablet 2   No facility-administered medications prior to visit.     PAST MEDICAL HISTORY: Past Medical History:  Diagnosis Date  . Contraception management    prefers OCP.  likes monthly period to come.   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 Pregnancy with history of miscarriage    related to Group B strep, twice  . Seizure (HCC) 2015   established with Dr. Vickey Huger 09/2016  . Wears contact lenses     PAST SURGICAL HISTORY: Past Surgical History:  Procedure Laterality Date  . CERVICAL CERCLAGE  2009  . CESAREAN SECTION N/A 2013    FAMILY HISTORY: Family History  Problem Relation Age of Onset  . Sarcoidosis Mother   . Diabetes Father   . Hypertension Father   . Cancer Paternal Grandmother   . Cancer Paternal Grandfather     prostate cancer  . Diabetes Paternal Grandfather   . Hypertension Paternal Grandfather     SOCIAL HISTORY: Social History   Social History  . Marital status: Single    Spouse name: N/A  . Number of children: N/A  . Years of education: N/A   Occupational History  . Not on file.   Social History Main Topics  . Smoking status: Former Smoker    Quit date: 04/25/2012  . Smokeless tobacco: Never Used  . Alcohol use 1.8 oz/week    3 Shots of liquor per week     Comment: socially  . Drug use: No  . Sexual activity: Not on file   Other Topics Concern  . Not on file   Social History Narrative   Lives with her daughter, 110yo.   Works at [employer] and [employer], Clinical biochemist.   Exercises.  Eats relatively healthy. Rest of family is in [place] and [place].  09/2016.        PHYSICAL EXAM  Vitals:   01/05/17 0949  BP:  121/86  Pulse: 63  Weight: 183 lb (83 kg)  Height: 5\' 4"  (1.626 m)   Body mass index is 31.41 kg/m.  Generalized: Well developed, in no acute distress   Neurological examination  Mentation: Alert oriented to time, place, history taking. Follows all commands speech and language fluent Cranial nerve II-XII: Pupils were equal round reactive to light. Extraocular movements were full, visual field were full on confrontational test. Facial sensation and strength were normal. Uvula tongue midline. Head turning and shoulder shrug  were normal and symmetric. Motor: The motor testing reveals 5 over 5 strength of all 4 extremities. Good symmetric motor tone is noted throughout.  Sensory: Sensory testing is intact to soft touch on all 4 extremities. No evidence of extinction is noted.  Coordination:  Cerebellar testing reveals good finger-nose-finger and heel-to-shin bilaterally.  Gait and station: Gait is normal. Tandem gait is normal. Romberg is negative. No drift is seen.  Reflexes: Deep tendon reflexes are symmetric and normal bilaterally.   DIAGNOSTIC DATA (LABS, IMAGING, TESTING) - I reviewed patient records, labs, notes, testing and imaging myself where available.  Lab Results  Component Value Date   WBC 8.3 10/12/2016   HGB 14.3 10/12/2016   HCT 42.7 10/12/2016   MCV 83.9 10/12/2016   PLT 314 10/12/2016      Component Value Date/Time   NA 138 10/12/2016 0526   K 3.9 10/12/2016 0526   CL 107 10/12/2016 0526   CO2 24 10/12/2016 0526   GLUCOSE 129 (H) 10/12/2016 0526   BUN <5 (L) 10/12/2016 0526   CREATININE 0.81 10/12/2016 0526   CALCIUM 9.4 10/12/2016 0526   PROT 6.3 (L) 10/11/2016 0254   ALBUMIN 3.8 10/11/2016 0254   AST 37 10/11/2016 0254   ALT 15 10/11/2016 0254   ALKPHOS 39 10/11/2016 0254   BILITOT 2.3 (H) 10/11/2016 0254   GFRNONAA >60 10/12/2016 0526   GFRAA >60 10/12/2016 0526   Lab Results  Component Value Date   CHOL 153 11/02/2016   HDL 48 (L) 11/02/2016    LDLCALC 94 11/02/2016   TRIG 55 11/02/2016   CHOLHDL 3.2 11/02/2016   Lab Results  Component Value Date   HGBA1C 5.3 11/02/2016    Lab Results  Component Value Date   TSH 0.89 11/02/2016      ASSESSMENT AND PLAN 31 y.o. year old Douglas  has a past medical history of Contraception management; Pregnancy with history of miscarriage; Seizure (HCC) (2015); and Wears contact lenses. here with:  1. Seizure 2. Vertigo  Overall the patient is doing well. She will continue on Keppra XR 500 mg twice a day. Advised that if she has any additional seizure event she she'll let us know. She must remain seizure-free for 6 months. She can resume driving in April if she does not have any additional seizures. Patient also advised to let us know if her vertigo returns. She will follow-up in 6 months or sooner if needed.     Butch PennyMegan Gayanne Prescott, MSN, NP-C 01/05/2017, 10:02 AM [facility] [address], [address] [place], [place] [address] [phone]

## 2017-07-06 ENCOUNTER — Ambulatory Visit: Payer: BLUE CROSS/BLUE SHIELD | Admitting: Adult Health

## 2017-07-14 ENCOUNTER — Telehealth: Payer: Self-pay | Admitting: Neurology

## 2017-07-14 NOTE — Telephone Encounter (Signed)
Called patient to make her aware that when the original prescription was given in Feb it was for a year supply. The pharmacy should have this information on file. No answer with the patient. LVM to call back for further questions otherwise she can check with the pharmacy to see if it is ready.

## 2017-07-14 NOTE — Telephone Encounter (Signed)
Pt calling for refill of levETIRAcetam (KEPPRA XR) 500 MG 24 hr tablet  Please send to  [facility] - [place], [place] - [address] [phone] (Phone) [phone] (Fax)

## 2018-03-06 DIAGNOSIS — L0231 Cutaneous abscess of buttock: Secondary | ICD-10-CM | POA: Diagnosis not present

## 2018-03-18 ENCOUNTER — Other Ambulatory Visit: Payer: Self-pay | Admitting: Adult Health

## 2018-03-19 NOTE — Telephone Encounter (Signed)
Patient last seen 12/2016. Note to pharmacy, re: have patient schedule follow up. 3 months supply, 0 refills given.

## 2018-03-26 DIAGNOSIS — Z1151 Encounter for screening for human papillomavirus (HPV): Secondary | ICD-10-CM | POA: Diagnosis not present

## 2018-03-26 DIAGNOSIS — Z6831 Body mass index (BMI) 31.0-31.9, adult: Secondary | ICD-10-CM | POA: Diagnosis not present

## 2018-03-26 DIAGNOSIS — Z113 Encounter for screening for infections with a predominantly sexual mode of transmission: Secondary | ICD-10-CM | POA: Diagnosis not present

## 2018-03-26 DIAGNOSIS — Z1159 Encounter for screening for other viral diseases: Secondary | ICD-10-CM | POA: Diagnosis not present

## 2018-03-26 DIAGNOSIS — Z01419 Encounter for gynecological examination (general) (routine) without abnormal findings: Secondary | ICD-10-CM | POA: Diagnosis not present

## 2018-03-26 DIAGNOSIS — Z114 Encounter for screening for human immunodeficiency virus [HIV]: Secondary | ICD-10-CM | POA: Diagnosis not present

## 2018-03-26 DIAGNOSIS — Z118 Encounter for screening for other infectious and parasitic diseases: Secondary | ICD-10-CM | POA: Diagnosis not present

## 2018-03-30 DIAGNOSIS — Z0000 Encounter for general adult medical examination without abnormal findings: Secondary | ICD-10-CM | POA: Diagnosis not present

## 2018-03-30 DIAGNOSIS — Z13220 Encounter for screening for lipoid disorders: Secondary | ICD-10-CM | POA: Diagnosis not present

## 2018-03-30 DIAGNOSIS — Z1329 Encounter for screening for other suspected endocrine disorder: Secondary | ICD-10-CM | POA: Diagnosis not present

## 2018-03-30 DIAGNOSIS — Z130 Encounter for screening for diseases of the blood and blood-forming organs and certain disorders involving the immune mechanism: Secondary | ICD-10-CM | POA: Diagnosis not present

## 2018-07-04 IMAGING — CT CT ANGIO HEAD
1 of 12 series · 5 of 33 positions shown · IV contrast (OMNI 350)
Comparison: None.

CLINICAL DATA: Vertigo.

EXAM:
CT ANGIOGRAPHY HEAD AND NECK
TECHNIQUE: Multidetector CT imaging of the head and neck was performed using
the standard protocol during bolus administration of intravenous
contrast. Multiplanar CT image reconstructions and MIPs were
obtained to evaluate the vascular anatomy. Carotid stenosis
measurements (when applicable) are obtained utilizing NASCET
criteria, using the distal internal carotid diameter as the
denominator.
CONTRAST:  50 cc Isovue 370 intravenous

[Series 11: cta neck axial · axial · 0.39mm/px · z∈[-282,-53]mm · 5 of 346 slices shown]
[im 58/346  soft-tissue]
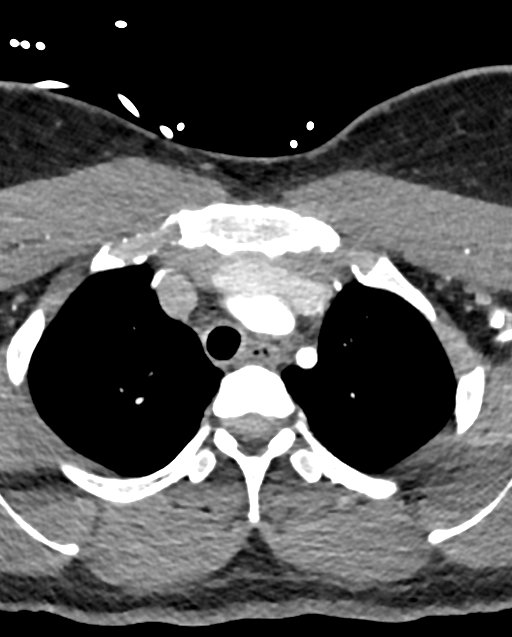
[im 116/346  bone]
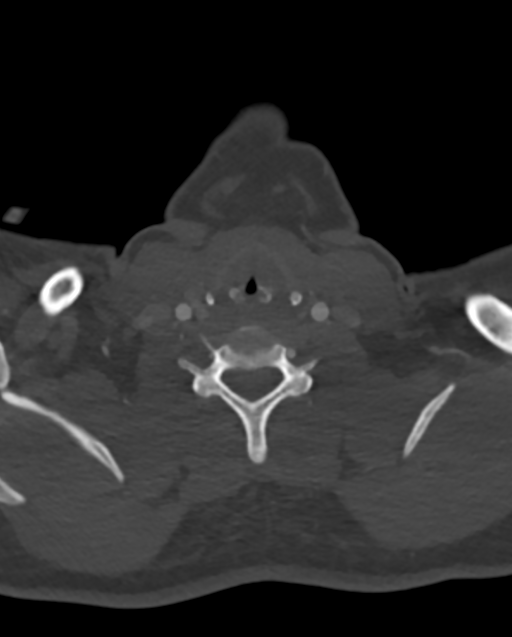
[im 173/346  soft-tissue]
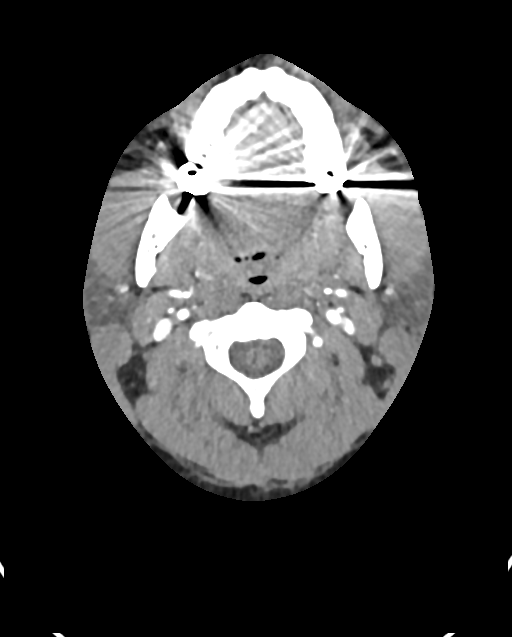
[im 231/346  bone]
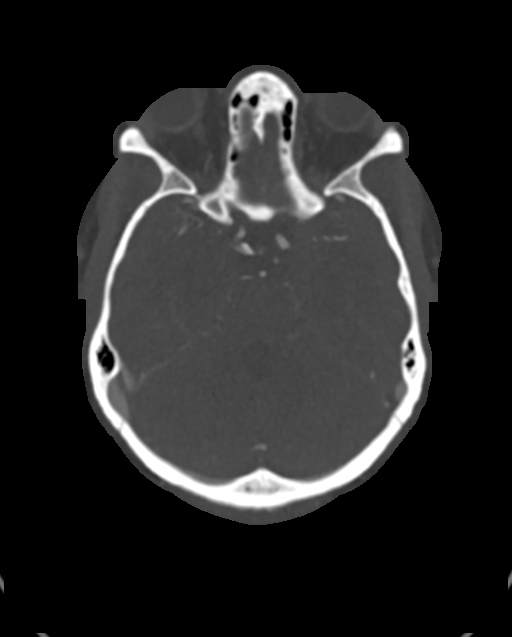
[im 288/346  soft-tissue]
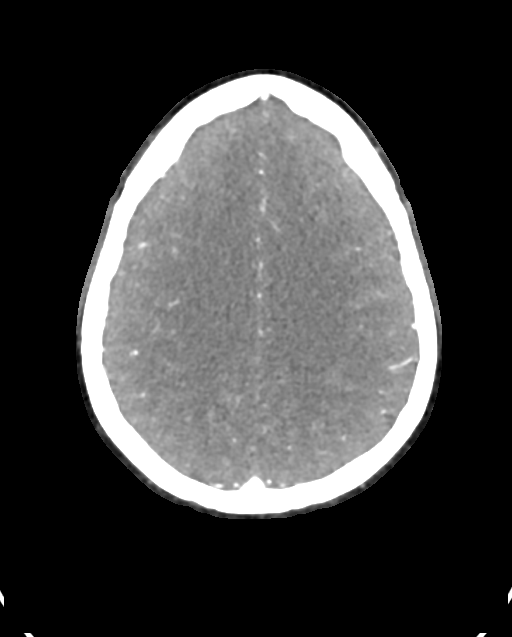

[5 of 33 positions shown; findings below may reference images not displayed]

FINDINGS: CT HEAD FINDINGS

Brain: Normal. No evidence of acute infarction, hemorrhage,
hydrocephalus, extra-axial collection or mass lesion/mass effect.

Vascular: Described below

Skull: No fracture or destructive lesion.

Sinuses:   Mucous retention cysts in the right maxillary antrum.

Orbits: Gaze to the right thumb also seen on the previous brain MRI.

Review of the MIP images confirms the above findings

CTA NECK FINDINGS

Aortic arch: Normal.  Normal.  Two vessel branching variant

Right carotid system: Asymmetrically small compared to the left in
the setting of aplastic right A1 segment. No atheromatous changes,
stenosis, dissection, or beading.

Left carotid system: No atheromatous changes, stenosis, dissection,
or beading.

Vertebral arteries: Strong left vertebral artery dominance. No
dissection or stenosis.

Skeleton: No acute or aggressive finding.

Other neck: Negative for adenopathy or mass.

Upper chest: Negative

Review of the MIP images confirms the above findings

CTA HEAD FINDINGS

Anterior circulation: Other than aplastic right A1 segment the
circle-of-Willis is intact. No stenosis, beading, major branch
occlusion, or aneurysm.

Posterior circulation: Non dominant right vertebral artery ends in
PICA. No stenosis, beading, or major branch occlusion. Negative for
aneurysm.

Venous sinuses: Patent

Anatomic variants: None unusual

Delayed phase:

Review of the MIP images confirms the above findings
IMPRESSION: Negative CTA of the head and neck. The right vertebral artery
highlighted on previous brain MRI is congenitally small.

## 2018-08-15 ENCOUNTER — Other Ambulatory Visit: Payer: Self-pay | Admitting: Neurology

## 2018-09-01 ENCOUNTER — Other Ambulatory Visit: Payer: Self-pay | Admitting: Neurology

## 2018-09-03 ENCOUNTER — Telehealth: Payer: Self-pay | Admitting: Adult Health

## 2018-09-03 MED ORDER — LEVETIRACETAM ER 500 MG PO TB24: 1000.0000 mg | ORAL_TABLET | Freq: Every day | ORAL | 0 refills | Status: DC

## 2018-09-03 NOTE — Telephone Encounter (Signed)
Pt called scheduled her an appt for a follow up for 1/21 due to pts work schedule this was the next available she could take. Needing refills for medication levETIRAcetam (KEPPRA XR) 500 MG 24 hr tablet sent to [facility]

## 2018-10-16 ENCOUNTER — Encounter: Payer: Self-pay | Admitting: Medical

## 2018-10-16 ENCOUNTER — Ambulatory Visit: Payer: BLUE CROSS/BLUE SHIELD | Admitting: Medical

## 2018-10-16 VITALS — BP 126/80 | HR 70 | Temp 98.1°F | Resp 16 | Ht 62.0 in | Wt 176.4 lb

## 2018-10-16 DIAGNOSIS — L299 Pruritus, unspecified: Secondary | ICD-10-CM | POA: Insufficient documentation

## 2018-10-16 DIAGNOSIS — Z79899 Other long term (current) drug therapy: Secondary | ICD-10-CM | POA: Diagnosis not present

## 2018-10-16 DIAGNOSIS — Z309 Encounter for contraceptive management, unspecified: Secondary | ICD-10-CM | POA: Diagnosis not present

## 2018-10-16 LAB — POCT URINE PREGNANCY: PREG TEST UR: NEGATIVE

## 2018-10-16 MED ORDER — NORETHIN-ETH ESTRAD-FE BIPHAS 1 MG-10 MCG / 10 MCG PO TABS: 1.0000 | ORAL_TABLET | Freq: Every day | ORAL | 0 refills | Status: DC

## 2018-10-16 MED ORDER — HYDROXYZINE HCL 10 MG PO TABS: 10.0000 mg | ORAL_TABLET | Freq: Three times a day (TID) | ORAL | 0 refills | Status: DC | PRN

## 2018-10-16 NOTE — Progress Notes (Signed)
Subjective: Chief Complaint  Patient presents with  . hands itchy    hands itching and feet X saturday    Here for itching on feet, palms, thighs.   Started 4 days ago.  No specific trigger, no rash, no fever, no body aches, no chills, no sick contacts with similar.  no NVD, no abdominal pain.   Last month before her menstrual cycle felt some upper back aches.  Periods is getting ready to start again in a week.  Has had some itchiness like this in the past after pregnancy on prenatal vitamins.   But after stopping the vitamins the itching went away.  Using Lubriderm cream for symptoms.  Tried benadryl cream too.  Needs to get back on birth control.  Doesn't want unintended pregnancy.   No recent OCP, but has been on pills in the past.   Is good about taking daily pills.  Is sexually active.   Saw Dr. Juliene Pina in 03/2018 for gyn visit, had labs including STD screen then.   Was considering IUD then backed out .  Wants to try Lo Lo recommend by her friend.   She has smoked occasionally /socially but not daily smoker.   No other aggravating or relieving factors. No other complaint.   Past Medical History:  Diagnosis Date  . Contraception management    prefers OCP.  likes monthly period to come.   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 Pregnancy with history of miscarriage    related to Group B strep, twice  . Seizure (HCC) 2015   established with Dr. Vickey Huger 09/2016  . Wears contact lenses    Current Outpatient Medications on File Prior to Visit  Medication Sig Dispense Refill  . levETIRAcetam (KEPPRA XR) 500 MG 24 hr tablet Take 2 tablets (1,000 mg total) by mouth daily. 180 tablet 0   No current facility-administered medications on file prior to visit.    ROS as in subjective   Objective: BP 126/80   Pulse 70   Temp 98.1 F (36.7 C) (Oral)   Resp 16   Ht 5\' 2"  (1.575 m)   Wt 176 lb 6.4 oz (80 kg)   SpO2 99%   BMI 32.26 kg/m    General  appearance: alert, no distress, WD/WN, AA female Oral cavity: MMM, no lesions Neck: supple, no lymphadenopathy, no thyromegaly, no masses Heart: RRR, normal S1, S2, no murmurs Lungs: CTA bilaterally, no wheezes, rhonchi, or rales Abdomen: +bs, soft, non tender, non distended, no masses, no hepatomegaly, no splenomegaly Back: non tender Musculoskeletal: nontender, no swelling, no obvious deformity Extremities: no edema, no cyanosis, no clubbing Pulses: 2+ symmetric, upper and lower extremities, normal cap refill Neurological: alert, oriented x 3, CN2-12 intact, strength normal upper extremities and lower extremities, sensation normal throughout, DTRs 2+ throughout, no cerebellar signs, gait normal Psychiatric: normal affect, behavior normal, pleasant    Assessment: Encounter Diagnoses  Name Primary?  . Pruritic condition Yes  . Encounter for contraceptive management, unspecified type   . High risk medication use      Plan: Pruritic condition - labs as below, begin hydroxyzine.  She likely has some irritant contact, but not sure of trigger.   Advised if not improved within 4-5 days, to call back.  She requested labs today  We discussed her interest in oral hormonal contraception.  She was not scheduled for talk on contraception but she voices desire to get back on OCP immediately.  Is sexually active, uses condoms but doesn't want to take changes.  She occasionally smokes, socially but not daily.   We discussed risks of OCPs, particularly if using tobacco. She notes that she is not a regular smoker and will abstain .  Discussed treatment options, types of OCPs, various other methods available.  Discussed non hormonal contraception as well.  We discussed risks of OCPs including headache, nausea, breast tenderness, irregular bleeding or spotting, possible risks of blood clots, heart attack, stroke.     Discussed advantages of OCPs including possible decreased premenstrual symptoms, improving  cramps, regulating cycles, decreasing heavy flow.  Discussed proper use of medication.  She understands the benefits, risks, and wishes to begin OCPs. Urine pregnancy negative.    F/u in 30month for physical/contraception f/u.  Shanda BumpsJessica was seen today for hands itchy.  Diagnoses and all orders for this visit:  Pruritic condition -     CBC -     Comprehensive metabolic panel -     Cancel: POCT urine pregnancy  Encounter for contraceptive management, unspecified type -     Cancel: POCT urine pregnancy -     POCT urine pregnancy  High risk medication use -     CBC -     Comprehensive metabolic panel -     Cancel: POCT urine pregnancy  Other orders -     hydrOXYzine (ATARAX/VISTARIL) 10 MG tablet; Take 1 tablet (10 mg total) by mouth 3 (three) times daily as needed. -     Norethindrone-Ethinyl Estradiol-Fe Biphas (LO LOESTRIN FE) 1 MG-10 MCG / 10 MCG tablet; Take 1 tablet by mouth daily.

## 2018-10-17 LAB — CBC
Hematocrit: 40.6 % (ref 34.0–46.6)
Hemoglobin: 13.3 g/dL (ref 11.1–15.9)
MCH: 27.5 pg (ref 26.6–33.0)
MCHC: 32.8 g/dL (ref 31.5–35.7)
MCV: 84 fL (ref 79–97)
PLATELETS: 307 10*3/uL (ref 150–450)
RBC: 4.84 x10E6/uL (ref 3.77–5.28)
RDW: 12 % — AB (ref 12.3–15.4)
WBC: 6.1 10*3/uL (ref 3.4–10.8)

## 2018-10-17 LAB — COMPREHENSIVE METABOLIC PANEL
ALK PHOS: 58 IU/L (ref 39–117)
ALT: 16 IU/L (ref 0–32)
AST: 16 IU/L (ref 0–40)
Albumin/Globulin Ratio: 2.1 (ref 1.2–2.2)
Albumin: 4.7 g/dL (ref 3.5–5.5)
BUN/Creatinine Ratio: 9 (ref 9–23)
BUN: 7 mg/dL (ref 6–20)
Bilirubin Total: 0.4 mg/dL (ref 0.0–1.2)
CALCIUM: 9.6 mg/dL (ref 8.7–10.2)
CO2: 23 mmol/L (ref 20–29)
CREATININE: 0.78 mg/dL (ref 0.57–1.00)
Chloride: 104 mmol/L (ref 96–106)
GFR calc Af Amer: 116 mL/min/{1.73_m2} (ref >59)
GFR calc non Af Amer: 100 mL/min/{1.73_m2} (ref >59)
GLUCOSE: 83 mg/dL (ref 65–99)
Globulin, Total: 2.2 g/dL (ref 1.5–4.5)
Potassium: 4.5 mmol/L (ref 3.5–5.2)
SODIUM: 138 mmol/L (ref 134–144)
Total Protein: 6.9 g/dL (ref 6.0–8.5)

## 2018-11-19 ENCOUNTER — Other Ambulatory Visit: Payer: Self-pay | Admitting: Medical

## 2018-11-29 ENCOUNTER — Other Ambulatory Visit: Payer: Self-pay | Admitting: Adult Health

## 2018-12-03 ENCOUNTER — Ambulatory Visit: Payer: BLUE CROSS/BLUE SHIELD | Admitting: Medical

## 2018-12-03 ENCOUNTER — Encounter: Payer: Self-pay | Admitting: Medical

## 2018-12-03 ENCOUNTER — Other Ambulatory Visit (HOSPITAL_COMMUNITY)
Admission: RE | Admit: 2018-12-03 | Discharge: 2018-12-03 | Disposition: A | Discharge status: Home / Self Care | Payer: BLUE CROSS/BLUE SHIELD | Source: Ambulatory Visit | Attending: Medical | Admitting: Medical

## 2018-12-03 VITALS — BP 120/70 | HR 68 | Temp 97.9°F | Resp 16 | Ht 62.0 in | Wt 180.6 lb

## 2018-12-03 DIAGNOSIS — Z23 Encounter for immunization: Secondary | ICD-10-CM | POA: Diagnosis not present

## 2018-12-03 DIAGNOSIS — M546 Pain in thoracic spine: Secondary | ICD-10-CM

## 2018-12-03 DIAGNOSIS — R5383 Other fatigue: Secondary | ICD-10-CM

## 2018-12-03 DIAGNOSIS — Z124 Encounter for screening for malignant neoplasm of cervix: Secondary | ICD-10-CM | POA: Insufficient documentation

## 2018-12-03 DIAGNOSIS — Z0000 Encounter for general adult medical examination without abnormal findings: Secondary | ICD-10-CM

## 2018-12-03 DIAGNOSIS — G40409 Other generalized epilepsy and epileptic syndromes, not intractable, without status epilepticus: Secondary | ICD-10-CM | POA: Diagnosis not present

## 2018-12-03 DIAGNOSIS — Z113 Encounter for screening for infections with a predominantly sexual mode of transmission: Secondary | ICD-10-CM | POA: Insufficient documentation

## 2018-12-03 DIAGNOSIS — Z79899 Other long term (current) drug therapy: Secondary | ICD-10-CM | POA: Diagnosis not present

## 2018-12-03 DIAGNOSIS — Z3040 Encounter for surveillance of contraceptives, unspecified: Secondary | ICD-10-CM | POA: Diagnosis not present

## 2018-12-03 LAB — POCT URINE PREGNANCY: Preg Test, Ur: NEGATIVE

## 2018-12-03 LAB — POCT URINALYSIS DIP (PROADVANTAGE DEVICE)
Bilirubin, UA: NEGATIVE
Blood, UA: NEGATIVE
Glucose, UA: NEGATIVE mg/dL
Ketones, POC UA: NEGATIVE mg/dL
Leukocytes, UA: NEGATIVE
Nitrite, UA: NEGATIVE
PH UA: 6 (ref 5.0–8.0)
Protein Ur, POC: NEGATIVE mg/dL
Specific Gravity, Urine: 1.02
UUROB: NEGATIVE

## 2018-12-03 MED ORDER — NORETHIN-ETH ESTRAD-FE BIPHAS 1 MG-10 MCG / 10 MCG PO TABS: 1.0000 | ORAL_TABLET | Freq: Every day | ORAL | 11 refills | Status: DC

## 2018-12-03 NOTE — Patient Instructions (Signed)
Thanks for trusting us with your health care and for coming in for a physical today.  Below are some general recommendations I have for you:  Yearly screenings See your eye doctor yearly for routine vision care. See your dentist yearly for routine dental care including hygiene visits twice yearly. See me here yearly for a routine physical and preventative care visit  Ophthalmology Dr. Glenford PeersHoward McFarland [address], [place], [place] [address] [phone]   [facility] Dr. Gelene Minkimothy Koop [address], [address] [place], [place] [address]  [phone] [email or web address]   Vincenza HewsSigmund S. Gould, M.D. Susanne GreenhouseJason A. Gould, O.D. [address], [address] [place], [place] [address] Medical telephone: [phone] Optical telephone: [phone]  Dr. Yancey Flemingsavid Civils, dentist [address], [place], [place] [address] [phone] [email or web address]      Please follow up yearly for a physical.    I have included other useful information below for your review.  Preventative Care for Adults - Female      MAINTAIN REGULAR HEALTH EXAMS:  A routine yearly physical is a good way to check in with your primary care provider about your health and preventive screening. It is also an opportunity to share updates about your health and any concerns you have, and receive a thorough all-over exam.   Most health insurance companies pay for at least some preventative services.  Check with your health plan for specific coverages.  WHAT PREVENTATIVE SERVICES DO WOMEN NEED?  Adult women should have their weight and blood pressure checked regularly.   Women age 34 and older should have their cholesterol levels checked regularly.  Women should be screened for cervical cancer with a Pap smear and pelvic exam beginning at either age 34, or 3 years after they become sexually activity.    Breast cancer screening generally begins at age 34 with a mammogram and breast exam by your primary care  provider.    Beginning at age 34 and continuing to age 34, women should be screened for colorectal cancer.  Certain people may need continued testing until age 785.  Updating vaccinations is part of preventative care.  Vaccinations help protect against diseases such as the flu.  Osteoporosis is a disease in which the bones lose minerals and strength as we age. Women ages 5165 and over should discuss this with their caregivers, as should women after menopause who have other risk factors.  Lab tests are generally done as part of preventative care to screen for anemia and blood disorders, to screen for problems with the kidneys and liver, to screen for bladder problems, to check blood sugar, and to check your cholesterol level.  Preventative services generally include counseling about diet, exercise, avoiding tobacco, drugs, excessive alcohol consumption, and sexually transmitted infections.    GENERAL RECOMMENDATIONS FOR GOOD HEALTH:  Healthy diet:  Eat a variety of foods, including fruit, vegetables, animal or vegetable protein, such as meat, fish, chicken, and eggs, or beans, lentils, tofu, and grains, such as rice.  Drink plenty of water daily.  Decrease saturated fat in the diet, avoid lots of red meat, processed foods, sweets, fast foods, and fried foods.  Exercise:  Aerobic exercise helps maintain good heart health. At least 30-40 minutes of moderate-intensity exercise is recommended. For example, a brisk walk that increases your heart rate and breathing. This should be done on most days of the week.   Find a type of exercise or a variety of exercises that you enjoy so that it becomes  a part of your daily life.  Examples are running, walking, swimming, water aerobics, and biking.  For motivation and support, explore group exercise such as aerobic class, spin class, Zumba, Yoga,or  martial arts, etc.    Set exercise goals for yourself, such as a certain weight goal, walk or run in a race  such as a 5k walk/run.  Speak to your primary care provider about exercise goals.  Disease prevention:  If you smoke or chew tobacco, find out from your caregiver how to quit. It can literally save your life, no matter how long you have been a tobacco user. If you do not use tobacco, never begin.   Maintain a healthy diet and normal weight. Increased weight leads to problems with blood pressure and diabetes.   The Body Mass Index or BMI is a way of measuring how much of your body is fat. Having a BMI above 27 increases the risk of heart disease, diabetes, hypertension, stroke and other problems related to obesity. Your caregiver can help determine your BMI and based on it develop an exercise and dietary program to help you achieve or maintain this important measurement at a healthful level.  High blood pressure causes heart and blood vessel problems.  Persistent high blood pressure should be treated with medicine if weight loss and exercise do not work.   Fat and cholesterol leaves deposits in your arteries that can block them. This causes heart disease and vessel disease elsewhere in your body.  If your cholesterol is found to be high, or if you have heart disease or certain other medical conditions, then you may need to have your cholesterol monitored frequently and be treated with medication.   Ask if you should have a cardiac stress test if your history suggests this. A stress test is a test done on a treadmill that looks for heart disease. This test can find disease prior to there being a problem.  Menopause can be associated with physical symptoms and risks. Hormone replacement therapy is available to decrease these. You should talk to your caregiver about whether starting or continuing to take hormones is right for you.   Osteoporosis is a disease in which the bones lose minerals and strength as we age. This can result in serious bone fractures. Risk of osteoporosis can be identified using a  bone density scan. Women ages 75 and over should discuss this with their caregivers, as should women after menopause who have other risk factors. Ask your caregiver whether you should be taking a calcium supplement and Vitamin D, to reduce the rate of osteoporosis.   Avoid drinking alcohol in excess (more than two drinks per day).  Avoid use of street drugs. Do not share needles with anyone. Ask for professional help if you need assistance or instructions on stopping the use of alcohol, cigarettes, and/or drugs.  Brush your teeth twice a day with fluoride toothpaste, and floss once a day. Good oral hygiene prevents tooth decay and gum disease. The problems can be painful, unattractive, and can cause other health problems. Visit your dentist for a routine oral and dental check up and preventive care every 6-12 months.   Look at your skin regularly.  Use a mirror to look at your back. Notify your caregivers of changes in moles, especially if there are changes in shapes, colors, a size larger than a pencil eraser, an irregular border, or development of new moles.  Safety:  Use seatbelts 100% of the time, whether driving  or as a passenger.  Use safety devices such as hearing protection if you work in environments with loud noise or significant background noise.  Use safety glasses when doing any work that could send debris in to the eyes.  Use a helmet if you ride a bike or motorcycle.  Use appropriate safety gear for contact sports.  Talk to your caregiver about gun safety.  Use sunscreen with a SPF (or skin protection factor) of 15 or greater.  Lighter skinned people are at a greater risk of skin cancer. Don't forget to also wear sunglasses in order to protect your eyes from too much damaging sunlight. Damaging sunlight can accelerate cataract formation.   Practice safe sex. Use condoms. Condoms are used for birth control and to help reduce the spread of sexually transmitted infections (or STIs).  Some  of the STIs are gonorrhea (the clap), chlamydia, syphilis, trichomonas, herpes, HPV (human papilloma virus) and HIV (human immunodeficiency virus) which causes AIDS. The herpes, HIV and HPV are viral illnesses that have no cure. These can result in disability, cancer and death.   Keep carbon monoxide and smoke detectors in your home functioning at all times. Change the batteries every 6 months or use a model that plugs into the wall.   Vaccinations:  Stay up to date with your tetanus shots and other required immunizations. You should have a booster for tetanus every 10 years. Be sure to get your flu shot every year, since 5%-20% of the [place] population comes down with the flu. The flu vaccine changes each year, so being vaccinated once is not enough. Get your shot in the fall, before the flu season peaks.   Other vaccines to consider:  Human Papilloma Virus or HPV causes cancer of the cervix, and other infections that can be transmitted from person to person. There is a vaccine for HPV, and females should get immunized between the ages of 6611 and 3426. It requires a series of 3 shots.   Pneumococcal vaccine to protect against certain types of pneumonia.  This is normally recommended for adults age 34 or older.  However, adults younger than 34 years old with certain underlying conditions such as diabetes, heart or lung disease should also receive the vaccine.  Shingles vaccine to protect against Varicella Zoster if you are older than age 34, or younger than 34 years old with certain underlying illness.  If you have not had the Shingrix vaccine, please call your insurer to inquire about coverage for the Shingrix vaccine given in 2 doses.   Some insurers cover this vaccine after age 34, some cover this after age 34.  If your insurer covers this, then call to schedule appointment to have this vaccine here  Hepatitis A vaccine to protect against a form of infection of the liver by a virus acquired from  food.  Hepatitis B vaccine to protect against a form of infection of the liver by a virus acquired from blood or body fluids, particularly if you work in health care.  If you plan to travel internationally, check with your local health department for specific vaccination recommendations.  Cancer Screening:  Breast cancer screening is essential to preventive care for women. All women age 34 and older should perform a breast self-exam every month. At age 34 and older, women should have their caregiver complete a breast exam each year. Women at ages 4840 and older should have a mammogram (x-ray film) of the breasts. Your caregiver can discuss how often  you need mammograms.    Cervical cancer screening includes taking a Pap smear (sample of cells examined under a microscope) from the cervix (end of the uterus). It also includes testing for HPV (Human Papilloma Virus, which can cause cervical cancer). Screening and a pelvic exam should begin at age 21, or 3 years after a woman becomes sexually active. Screening should occur every year, with a Pap smear but no HPV testing, up to age 30. After age 30, you should have a Pap smear every 3 years with HPV testing, if no HPV was found previously.   Most routine colon cancer screening begins at the age of 50. On a yearly basis, doctors may provide special easy to use take-home tests to check for hidden blood in the stool. Sigmoidoscopy or colonoscopy can detect the earliest forms of colon cancer and is life saving. These tests use a small camera at the end of a tube to directly examine the colon. Speak to your caregiver about this at age 50, when routine screening begins (and is repeated every 5 years unless early forms of pre-cancerous polyps or small growths are found).    

## 2018-12-03 NOTE — Addendum Note (Signed)
Addended by: Derinda Late on: 12/03/2018 10:39 AM   Modules accepted: Orders

## 2018-12-03 NOTE — Progress Notes (Signed)
Subjective: Chief Complaint  Patient presents with  . CPE    CPE fasting  with pap  eye exam 12-19   Medical team: Dr. Melvyn Novas, neurology along with Butch Penny, NP [facility] [place] [facility] current dentist Tysinger, Kermit Balo, PA-C here for primary care  Concerns: Lately been tired a lot.  Upper back hurts at times.   Just recently started back exercising and stretching.  Has hx/o left breast lump left for years since teenage years.    Sometimes feels swollen during menstrual cycle.   No changes in the lump though.  On OCP, continues on current OCP.   Hx/o 2 miscarriages, 1 abortion and 1 live birth, 4 total pregnancies.   Sexual active.   Has relationship x 2 years, monogamous, doesn't use condoms.     Reviewed their medical, surgical, family, social, medication, and allergy history and updated chart as appropriate.  Past Medical History:  Diagnosis Date  . Contraception management    prefers OCP.  likes monthly period to come.   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 Pregnancy with history of miscarriage    related to Group B strep, twice  . Seizure (HCC) 2015   established with Dr. Vickey Huger 09/2016  . Wears contact lenses     Past Surgical History:  Procedure Laterality Date  . CERVICAL CERCLAGE  2009  . CESAREAN SECTION N/A 2013    Social History   Socioeconomic History  . Marital status: Single    Spouse name: Not on file  . Number of children: Not on file  . Years of education: Not on file  . Highest education [place]: Not on file  Occupational History  . Not on file  Social Needs  . Financial resource strain: Not on file  . Food insecurity:    Worry: Not on file    Inability: Not on file  . Transportation needs:    Medical: Not on file    Non-medical: Not on file  Tobacco Use  . Smoking status: Former Smoker    Last attempt to quit: 04/25/2012    Years since quitting: 6.6  . Smokeless  tobacco: Never Used  Substance and Sexual Activity  . Alcohol use: Yes    Alcohol/week: 3.0 standard drinks    Types: 3 Shots of liquor per week    Comment: socially  . Drug use: No    Frequency: 1.0 times per week  . Sexual activity: Not on file  Lifestyle  . Physical activity:    Days per week: Not on file    Minutes per session: Not on file  . Stress: Not on file  Relationships  . Social connections:    Talks on phone: Not on file    Gets together: Not on file    Attends religious service: Not on file    Active member of club or organization: Not on file    Attends meetings of clubs or organizations: Not on file    Relationship status: Not on file  . Intimate partner violence:    Fear of current or ex partner: Not on file    Emotionally abused: Not on file    Physically abused: Not on file    Forced sexual activity: Not on file  Other Topics Concern  . Not on file  Social History Narrative   Lives with her daughter, Katie Douglas.   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   Exercises.  Eats relatively healthy. Rest of family [place] in [place].  11/2018    Family History  Problem Relation Age of Onset  . Sarcoidosis Mother   . Diabetes Father   . Hypertension Father   . Cancer Paternal Grandmother   . Cancer Paternal Grandfather        prostate cancer  . Diabetes Paternal Grandfather   . Hypertension Paternal Grandfather      Current Outpatient Medications:  .  levETIRAcetam (KEPPRA XR) 500 MG 24 hr tablet, TAKE 2 TABLETS BY MOUTH EVERY DAY, Disp: 180 tablet, Rfl: 0 .  LO LOESTRIN FE 1 MG-10 MCG / 10 MCG tablet, TAKE 1 TABLET BY MOUTH EVERY DAY, Disp: 28 tablet, Rfl: 0 .  hydrOXYzine (ATARAX/VISTARIL) 10 MG tablet, Take 1 tablet (10 mg total) by mouth 3 (three) times daily as needed. (Patient not taking: Reported on 12/03/2018), Disp: 30 tablet, Rfl: 0  Allergies  Allergen Reactions  . Nuvaring [Etonogestrel-Ethinyl Estradiol]     Had odor with this      Review of Systems Constitutional: -fever, -chills, -sweats, -unexpected weight change, -decreased appetite, -fatigue Allergy: -sneezing, -itching, -congestion Dermatology: -changing moles, --rash, -lumps ENT: -runny nose, -ear pain, -sore throat, -hoarseness, -sinus pain, -teeth pain, - ringing in ears, -hearing loss, -nosebleeds Cardiology: -chest pain, -palpitations, -swelling, -difficulty breathing when lying flat, -waking up short of breath Respiratory: -cough, -shortness of breath, -difficulty breathing with exercise or exertion, -wheezing, -coughing up blood Gastroenterology: -abdominal pain, -nausea, -vomiting, -diarrhea, -constipation, -blood in stool, -changes in bowel movement, -difficulty swallowing or eating Hematology: -bleeding, -bruising  Musculoskeletal: -joint aches, -muscle aches, -joint swelling, +back pain, -neck pain, -cramping, -changes in gait Ophthalmology: denies vision changes, eye redness, itching, discharge Urology: -burning with urination, -difficulty urinating, -blood in urine, -urinary frequency, -urgency, -incontinence Neurology: -headache, -weakness, -tingling, -numbness, -memory loss, -falls, -dizziness Psychology: -depressed mood, -agitation, -sleep problems Breast/gyn: -breast tenderness, -discharge, -lumps, -vaginal discharge,- irregular periods, -heavy periods     Objective:  BP 120/70   Pulse 68   Temp 97.9 F (36.6 C) (Oral)   Resp 16   Ht 5\' 2"  (1.575 m)   Wt 180 lb 9.6 oz (81.9 kg)   LMP 11/24/2018 (Exact Date)   SpO2 98%   BMI 33.03 kg/m   General appearance: alert, no distress, WD/WN, African American female Skin: no worrisome lesions HEENT: normocephalic, conjunctiva/corneas normal, sclerae anicteric, PERRLA, EOMi, nares patent, no discharge or erythema, pharynx normal Oral cavity: MMM, tongue normal, teeth normal Neck: supple, no lymphadenopathy, no thyromegaly, no masses, normal ROM, no bruits Chest: non tender, normal shape  and expansion Heart: RRR, normal S1, S2, no murmurs Lungs: CTA bilaterally, no wheezes, rhonchi, or rales Abdomen: +bs, soft, non tender, non distended, no masses, no hepatomegaly, no splenomegaly, no bruits Back: non tender, normal ROM, no scoliosis Musculoskeletal: upper extremities non tender, no obvious deformity, normal ROM throughout, lower extremities non tender, no obvious deformity, normal ROM throughout Extremities: no edema, no cyanosis, no clubbing Pulses: 2+ symmetric, upper and lower extremities, normal cap refill Neurological: alert, oriented x 3, CN2-12 intact, strength normal upper extremities and lower extremities, sensation normal throughout, DTRs 2+ throughout, no cerebellar signs, gait normal Psychiatric: normal affect, behavior normal, pleasant  Breast: nontender, no masses or lumps, no skin changes, no nipple discharge or inversion, no axillary lymphadenopathy Gyn: Normal external genitalia without lesions, vagina with normal mucosa, cervix without lesions, no cervical motion tenderness, no abnormal vaginal discharge.  Uterus and adnexa not enlarged, nontender, no masses.  Pap performed.  Exam chaperoned by nurse. Rectal: anus normal appearing    Assessment and Plan :   Encounter Diagnoses  Name Primary?  . Routine general medical examination at a health care facility Yes  . Epilepsy symptomatic, generalized (HCC)   . High risk medication use   . Encounter for surveillance of contraceptives, unspecified contraceptive   . Screening for cervical cancer   . Screen for STD (sexually transmitted disease)   . Need for Tdap vaccination     Physical exam - discussed and counseled on healthy lifestyle, diet, exercise, preventative care, vaccinations, sick and well care, proper use of emergency dept and after hours care, and addressed their concerns.    Health screening: Advised they see their eye doctor yearly for routine vision care. Advised they see their dentist  yearly for routine dental care including hygiene visits twice yearly.  Discussed STD testing, discussed prevention, condom use, means of transmission  Cancer screening Counseled on self breast exams, mammograms, cervical cancer screening  Vaccinations: Advised yearly influenza vaccine Patient declines influenza vaccine  Counseled on the Tdap (tetanus, diptheria, and acellular pertussis) vaccine.  Vaccine information sheet given. Tdap vaccine given after consent obtained.   Other  issues discussed: We discussed her interest in oral hormonal contraception.  Discussed treatment options, types of OCPs, various other methods available.  Discussed non hormonal contraception as well.  We discussed risks of OCPs including headache, nausea, breast tenderness, irregular bleeding or spotting, possible risks of blood clots, heart attack, stroke.     Discussed advantages of OCPs including possible decreased premenstrual symptoms, improving cramps, regulating cycles, decreasing heavy flow.  Discussed proper use of medication.  She understands the benefits, risks, and wishes to begin OCPs. Urine pregnancy negative.    Discussed safe sex, STD prevention, condom use.     Separate significant chronic issues discussed: Seizures - managed by neurology  fatigue - labs today, work on improved sleep hygiene   Katie Douglas was seen today for cpe.  Diagnoses and all orders for this visit:  Routine general medical examination at a health care facility -     POCT Urinalysis DIP (Proadvantage Device) -     Hemoglobin A1c -     Lipid panel -     TSH -     HIV Antibody (routine testing w rflx) -     RPR -     Cytology - PAP([place])  Epilepsy symptomatic, generalized (HCC)  High risk medication use  Encounter for surveillance of contraceptives, unspecified contraceptive  Screening for cervical cancer -     Cytology - PAP([place])  Screen for STD (sexually transmitted disease) -     HIV Antibody  (routine testing w rflx) -     RPR -     Cytology - PAP([place])  Need for Tdap vaccination   Follow-up pending labs, yearly for physical

## 2018-12-04 ENCOUNTER — Other Ambulatory Visit: Payer: Self-pay | Admitting: Medical

## 2018-12-04 LAB — RPR: RPR Ser Ql: NONREACTIVE

## 2018-12-04 LAB — LIPID PANEL
CHOL/HDL RATIO: 2.8 ratio (ref 0.0–4.4)
Cholesterol, Total: 153 mg/dL (ref 100–199)
HDL: 55 mg/dL (ref >39)
LDL Calculated: 83 mg/dL (ref 0–99)
Triglycerides: 75 mg/dL (ref 0–149)
VLDL CHOLESTEROL CAL: 15 mg/dL (ref 5–40)

## 2018-12-04 LAB — HIV ANTIBODY (ROUTINE TESTING W REFLEX): HIV Screen 4th Generation wRfx: NONREACTIVE

## 2018-12-04 LAB — TSH: TSH: 0.956 u[IU]/mL (ref 0.450–4.500)

## 2018-12-04 LAB — HEMOGLOBIN A1C
ESTIMATED AVERAGE GLUCOSE: 108 mg/dL
Hgb A1c MFr Bld: 5.4 % (ref 4.8–5.6)

## 2018-12-05 LAB — CYTOLOGY - PAP
Chlamydia: NEGATIVE
Diagnosis: NEGATIVE
Neisseria Gonorrhea: NEGATIVE

## 2018-12-11 ENCOUNTER — Encounter: Payer: Self-pay | Admitting: Neurology

## 2018-12-11 ENCOUNTER — Ambulatory Visit: Payer: Self-pay | Admitting: Neurology

## 2018-12-11 ENCOUNTER — Ambulatory Visit: Payer: BLUE CROSS/BLUE SHIELD | Admitting: Neurology

## 2018-12-11 VITALS — BP 138/82 | HR 77 | Ht 62.0 in | Wt 185.0 lb

## 2018-12-11 DIAGNOSIS — G40909 Epilepsy, unspecified, not intractable, without status epilepticus: Secondary | ICD-10-CM | POA: Diagnosis not present

## 2018-12-11 NOTE — Progress Notes (Signed)
PATIENT: Katie Douglas DOB: 04/02/1986  REASON FOR VISIT: follow up- seizures, vertigo HISTORY FROM: patient  HISTORY OF PRESENT ILLNESS:  12-11-2018; Katie Douglas is a meanwhile 33 year old patient who has been over 20 months seizure-free and was allowed to resume driving in April 1610.  She has been very compliant with her Keppra medication and it has an return worked very well for her.  Her primary care provider is Crosby Oyster , physician assistant. Just this months she had a blood draw on the regular physical with him and was not told about any abnormalities.  We will refill the medication today.  [name] is a 33 year old AAF . With a history of seizures and vertigo. She returns today for follow-up. She states that she has remained on Keppra XR 500 mg twice a day. She states that she was able to locate a pharmacy with the same manufacturer that she had previously. She states that since then she's not had any additional seizures. Reports that her last seizure was April 7. Denies any changes with her gait or balance. She is able to complete all ADLs independently. She is not operating a motor vehicle. She reports that her vertigo episodes resolved with vestibular rehabilitation. She returns today for an evaluation.  HISTORY 10/18/16: Katie Douglas is a 33 y.o. female  Is seen here as a referral  from Dr. David Stall, The Patient  is a 33 year old right-handed African-American female that presented on November 22 to the [facility] emergency room. She has a history of seizure activity and has been treated with Keppra. When she presented to the emergency room for chief complaint was acute vertigo. During her emergency room visit a CT angiogram of head and neck was performed which showed no dissection there was no stroke and she was started on a benzodiazepine, meclizine and vestibular rehabilitation was consulted. Physical therapy however evaluated the patient and recommended no  intervention as her symptoms had resolved by the time they came to see her. She was asked to follow up with [place] in neurology as an outpatient. She was also asked not to drive or operate machinery on her new 2 medications. Her seizure medications were refilled but not changed. She cannot drive until April 9604.  [name] Her last seizure was on October 7th of this year, prior to her vertigo presentation and apparently unrelated. Seizures begun 2 years ago, of unknown origin. usually appear to be tonic, she does not seem to convulse just to become very stiff and verbally unresponsive, she is also described as being postictally confused and combative. She is unsure about the duration of her seizures. She does not have them frequently. She suffered her first seizure, was evaluated at in [place] but not placed on medication as this had been a single event. 2 months later she had another spell and now was placed on medication. She was worked up with an EEG, had an imaging study, but I do not have the reports available. A total of 4 seizures thus far.   The patient reports that she was changed to a generic form of Keppra, actually between to generics to a different form. She had previously in [place] filled at [facility] made by [employer], 500 mg, of which she took  1 tab twice a day, she now changde to right-aid and the medication looks different but is described as levetiracetam 500 mg, she is concerned that this new generic has  not had the same seizure protective value. She was able to operate machinery and felt good on her previous generic medication, the first day she took the new medication she was unable to coordinate as well was drowsy and felt impaired.  REVIEW OF SYSTEMS: Out of a complete 14 system review of symptoms, the patient complains only of the following symptoms, and all other reviewed systems are negative.  See history of present illness  ALLERGIES: Allergies  Allergen  Reactions  . Nuvaring [Etonogestrel-Ethinyl Estradiol]     Had odor with this    HOME MEDICATIONS: Outpatient Medications Prior to Visit  Medication Sig Dispense Refill  . levETIRAcetam (KEPPRA XR) 500 MG 24 hr tablet TAKE 2 TABLETS BY MOUTH EVERY DAY 180 tablet 0  . Norethindrone-Ethinyl Estradiol-Fe Biphas (LO LOESTRIN FE) 1 MG-10 MCG / 10 MCG tablet Take 1 tablet by mouth daily. 28 tablet 11  . hydrOXYzine (ATARAX/VISTARIL) 10 MG tablet Take 1 tablet (10 mg total) by mouth 3 (three) times daily as needed. (Patient not taking: Reported on 12/03/2018) 30 tablet 0   No facility-administered medications prior to visit.     PAST MEDICAL HISTORY: Past Medical History:  Diagnosis Date  . Contraception management    prefers OCP.  likes monthly period to come.   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 Pregnancy with history of miscarriage    related to Group B strep, twice  . Seizure (HCC) 2015   established with Dr. Vickey Hugerohmeier 09/2016  . Wears contact lenses     PAST SURGICAL HISTORY: Past Surgical History:  Procedure Laterality Date  . CERVICAL CERCLAGE  2009  . CESAREAN SECTION N/A 2013    FAMILY HISTORY: Family History  Problem Relation Age of Onset  . Sarcoidosis Mother   . Diabetes Father   . Hypertension Father   . Cancer Paternal Grandmother   . Cancer Paternal Grandfather        prostate cancer  . Diabetes Paternal Grandfather   . Hypertension Paternal Grandfather     SOCIAL HISTORY: Social History   Socioeconomic History  . Marital status: Single    Spouse name: Not on file  . Number of children: Not on file  . Years of education: Not on file  . Highest education level: Not on file  Occupational History  . Not on file  Social Needs  . Financial resource strain: Not on file  . Food insecurity:    Worry: Not on file    Inability: Not on file  . Transportation needs:    Medical: Not on file    Non-medical: Not on file    Tobacco Use  . Smoking status: Former Smoker    Last attempt to quit: 04/25/2012    Years since quitting: 6.6  . Smokeless tobacco: Never Used  Substance and Sexual Activity  . Alcohol use: Yes    Alcohol/week: 3.0 standard drinks    Types: 3 Shots of liquor per week    Comment: socially  . Drug use: No    Frequency: 1.0 times per week  . Sexual activity: Not on file  Lifestyle  . Physical activity:    Days per week: Not on file    Minutes per session: Not on file  . Stress: Not on file  Relationships  . Social connections:    Talks on phone: Not on file    Gets together: Not on file    Attends religious service:  Not on file    Active member of club or organization: Not on file    Attends meetings of clubs or organizations: Not on file    Relationship status: Not on file  . Intimate partner violence:    Fear of current or ex partner: Not on file    Emotionally abused: Not on file    Physically abused: Not on file    Forced sexual activity: Not on file  Other Topics Concern  . Not on file  Social History Narrative   Lives with her daughter, Illa Level.   Works at [employer] and [employer], Clinical biochemist.   Exercises.  Eats relatively healthy. Rest of family is in [place] and [place].  11/2018      PHYSICAL EXAM  Vitals:   12/11/18 1515  BP: 138/82  Pulse: 77  Weight: 185 lb (83.9 kg)  Height: 5\' 2"  (1.575 m)   Body mass index is 33.84 kg/m. neck circumference 16",  mallampati 3. Retrognathia.   Generalized: Well developed, in no acute distress   Neurological examination  Mentation: Alert oriented to time, place, history taking. Follows all commands speech and language fluent Cranial nerve II-XII: Pupils were equal round reactive to light. Extraocular movements were full, visual field were full on confrontational test. Facial sensation and strength were normal. Uvula tongue midline. Head turning and shoulder shrug  were normal and symmetric. Motor: The motor testing reveals  5 over 5 strength of all 4 extremities. Good symmetric motor tone is noted throughout.  Sensory: Sensory testing is intact to soft touch on all 4 extremities. No evidence of extinction is noted.  Coordination: Cerebellar testing reveals good finger-nose-finger and heel-to-shin bilaterally.  Gait and station: Gait is normal. Tandem gait is normal. Romberg is negative. No drift is seen.  Reflexes: Deep tendon reflexes are symmetric and normal bilaterally.   DIAGNOSTIC DATA (LABS, IMAGING, TESTING) - I reviewed patient records, labs, notes, testing and imaging myself where available.  Lab Results  Component Value Date   WBC 6.1 10/16/2018   HGB 13.3 10/16/2018   HCT 40.6 10/16/2018   MCV 84 10/16/2018   PLT 307 10/16/2018      Component Value Date/Time   NA 138 10/16/2018 1145   K 4.5 10/16/2018 1145   CL 104 10/16/2018 1145   CO2 23 10/16/2018 1145   GLUCOSE 83 10/16/2018 1145   GLUCOSE 129 (H) 10/12/2016 0526   BUN 7 10/16/2018 1145   CREATININE 0.78 10/16/2018 1145   CALCIUM 9.6 10/16/2018 1145   PROT 6.9 10/16/2018 1145   ALBUMIN 4.7 10/16/2018 1145   AST 16 10/16/2018 1145   ALT 16 10/16/2018 1145   ALKPHOS 58 10/16/2018 1145   BILITOT 0.4 10/16/2018 1145   GFRNONAA 100 10/16/2018 1145   GFRAA 116 10/16/2018 1145   Lab Results  Component Value Date   CHOL 153 12/03/2018   HDL 55 12/03/2018   LDLCALC 83 12/03/2018   TRIG 75 12/03/2018   CHOLHDL 2.8 12/03/2018   Lab Results  Component Value Date   HGBA1C 5.4 12/03/2018    Lab Results  Component Value Date   TSH 0.956 12/03/2018      ASSESSMENT AND PLAN 33 y.o. year old female  has a past medical history of Contraception management, Pregnancy with history of miscarriage, Seizure (HCC) (2015), and Wears contact lenses. here with:  1. Seizure 2. Vertigo  Overall the patient is doing well. She will continue on Keppra XR 500 mg twice a day. Advised  that if she has any additional seizure event she she'll let  us know.  She had resumed driving in April 2019 after 6 month of seizure freedom, now has been almost 22 month seizure free.  She will follow-up in 12 months or sooner if needed.    Melvyn Novas, MD  12/11/2018, 3:30 PM [facility] [address], [address] [place], [place] [address] [phone]

## 2019-03-04 ENCOUNTER — Other Ambulatory Visit: Payer: Self-pay | Admitting: Adult Health

## 2019-05-10 ENCOUNTER — Other Ambulatory Visit: Payer: Self-pay | Admitting: Neurology

## 2019-12-19 ENCOUNTER — Other Ambulatory Visit: Payer: Self-pay | Admitting: Medical

## 2019-12-20 NOTE — Telephone Encounter (Signed)
[facility] is requesting to fill pt [facility]. Please advise [facility]

## 2019-12-23 NOTE — Telephone Encounter (Signed)
Get in for yearly physical .  I received request for birth control

## 2019-12-24 NOTE — Telephone Encounter (Signed)
Patient has scheduled her physical.

## 2020-01-28 ENCOUNTER — Ambulatory Visit (INDEPENDENT_AMBULATORY_CARE_PROVIDER_SITE_OTHER): Payer: Medicaid Other | Admitting: Medical

## 2020-01-28 ENCOUNTER — Encounter: Payer: Self-pay | Admitting: Medical

## 2020-01-28 ENCOUNTER — Other Ambulatory Visit: Payer: Self-pay

## 2020-01-28 ENCOUNTER — Encounter: Payer: Medicaid Other | Admitting: Medical

## 2020-01-28 VITALS — BP 114/80 | HR 77 | Temp 98.1°F | Ht 62.0 in | Wt 172.4 lb

## 2020-01-28 DIAGNOSIS — Z3041 Encounter for surveillance of contraceptive pills: Secondary | ICD-10-CM

## 2020-01-28 DIAGNOSIS — Z7189 Other specified counseling: Secondary | ICD-10-CM | POA: Diagnosis not present

## 2020-01-28 LAB — POCT URINE PREGNANCY: Preg Test, Ur: NEGATIVE

## 2020-01-28 MED ORDER — LO LOESTRIN FE 1 MG-10 MCG / 10 MCG PO TABS: 1.0000 | ORAL_TABLET | Freq: Every day | ORAL | 11 refills | Status: DC

## 2020-01-28 NOTE — Addendum Note (Signed)
Addended by: Victorio Palm on: 01/28/2020 11:17 AM   Modules accepted: Orders

## 2020-01-28 NOTE — Progress Notes (Signed)
Subjective: Chief Complaint  Patient presents with  . Consult    discuss birth control   Here for contraception.  Hx/o 3 pregnancies, 1 live birth, 2 miscarriages.  No hx/o DVT or PE in self or family.   Is taking Loestrin Lo Fe for about a year.    Doing ok on current birth control, wants to continue this.   No side effects of medications.   Former smoker, quit cigarettes about a year ago.  No current sexual partner.  No new partner since last STD screen.  Been out of contraception medicaiton 2 months.  Periods are regular every 28 days, periods last 5- 7 days.     No other concerns.   In usual state of health.  Doing self breast exams.    Past Medical History:  Diagnosis Date  . Contraception management    prefers OCP.  likes monthly period to come.   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 Pregnancy with history of miscarriage    related to Group B strep, twice  . Seizure (HCC) 2015   established with Dr. Vickey Huger 09/2016  . Wears contact lenses    Current Outpatient Medications on File Prior to Visit  Medication Sig Dispense Refill  . levETIRAcetam (KEPPRA XR) 500 MG 24 hr tablet TAKE 2 TABLETS BY MOUTH EVERY DAY 180 tablet 1   No current facility-administered medications on file prior to visit.   ROS as in subjective    Objective: BP 114/80   Pulse 77   Temp 98.1 F (36.7 C)   Ht 5\' 2"  (1.575 m)   Wt 172 lb 6.4 oz (78.2 kg)   SpO2 99%   BMI 31.53 kg/m   Wt Readings from Last 3 Encounters:  01/28/20 172 lb 6.4 oz (78.2 kg)  12/11/18 185 lb (83.9 kg)  12/03/18 180 lb 9.6 oz (81.9 kg)   General appearence: alert, no distress, WD/WN,  Neck: supple, no lymphadenopathy, no thyromegaly, no masses Heart: RRR, normal S1, S2, no murmurs Lungs: CTA bilaterally, no wheezes, rhonchi, or rales Abdomen: +bs, soft, non tender, non distended, no masses, no hepatomegaly, no splenomegaly Pulses: 2+ symmetric, upper and lower extremities, normal  cap refill Declines breast and pelvic exam    Assessment: Encounter Diagnoses  Name Primary?  . Encounter for surveillance of contraceptive pills Yes  . Counseling on health promotion and disease prevention      Plan: We discussed her interest in oral hormonal contraception.  Discussed treatment options, types of OCPs, various other methods available.  Discussed non hormonal contraception as well.  We discussed risks of OCPs including headache, nausea, breast tenderness, irregular bleeding or spotting, possible risks of blood clots, heart attack, stroke.   Discussed advantages of OCPs including possible decreased premenstrual symptoms, improving cramps, regulating cycles, decreasing heavy flow.  Discussed proper use of medication.  She understands the benefits, risks, and wishes to begin OCPs. Urine pregnancy negative.    Discussed safe sex, STD prevention, condom use.     Counseled on general wellness, exercise, health diet, routine f/u, self breast exams, pap smear.  Pap is up to date.  Due for pap and HPV test next year.    Diona was seen today for consult.  Diagnoses and all orders for this visit:  Encounter for surveillance of contraceptive pills  Counseling on health promotion and disease prevention  Other orders -     Norethindrone-Ethinyl Estradiol-Fe Biphas (LO LOESTRIN FE)  1 MG-10 MCG / 10 MCG tablet; Take 1 tablet by mouth daily.

## 2020-03-02 ENCOUNTER — Other Ambulatory Visit: Payer: Self-pay | Admitting: Neurology

## 2020-03-23 ENCOUNTER — Telehealth: Payer: Self-pay | Admitting: Neurology

## 2020-03-23 MED ORDER — LEVETIRACETAM ER 500 MG PO TB24: 1000.0000 mg | ORAL_TABLET | Freq: Every day | ORAL | 0 refills | Status: DC

## 2020-03-23 NOTE — Telephone Encounter (Signed)
Pt called and LVM stating she is needing a refill on her levETIRAcetam (KEPPRA XR) 500 MG 24 hr tablet but she no longer has insurance and is wanting to know how she could receive her medication. Please advise.

## 2020-03-23 NOTE — Telephone Encounter (Signed)
Called the patient back and reviewed using [facility] to help with cost of the script. [facility] her with the particular brand she needs. It has been over a yr since patient has been seen in office and in order to continue

## 2020-03-26 ENCOUNTER — Ambulatory Visit (INDEPENDENT_AMBULATORY_CARE_PROVIDER_SITE_OTHER): Payer: Self-pay | Admitting: Neurology

## 2020-03-26 ENCOUNTER — Other Ambulatory Visit: Payer: Self-pay

## 2020-03-26 ENCOUNTER — Encounter: Payer: Self-pay | Admitting: Neurology

## 2020-03-26 DIAGNOSIS — G40909 Epilepsy, unspecified, not intractable, without status epilepticus: Secondary | ICD-10-CM

## 2020-03-26 MED ORDER — LEVETIRACETAM ER 500 MG PO TB24: 1000.0000 mg | ORAL_TABLET | Freq: Every day | ORAL | 11 refills | Status: DC

## 2020-03-26 NOTE — Progress Notes (Signed)
PATIENT: Katie Douglas DOB: [birth year]  REASON FOR VISIT: follow up- seizures, vertigo HISTORY FROM: patient  HISTORY OF PRESENT ILLNESS:   03-26-2020, RV  Patient of Crosby Oyster, [place] .  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  Social history _ Parents and grandparent were covid vaccinated, she is due Friday. She had no seizures in years- and remains on Keppra- highly compliant.  She has no room to wean off- she could theoretically be weaned off Keppra after 5 years of seizure freedom. refill today.    12-11-2018; Katie Douglas is a meanwhile 34 year old patient who has been over 20 months seizure-free and was allowed to resume driving in April 1478.  She has been very compliant with her Keppra medication and it has an return worked very well for her.  Her primary care provider is Crosby Oyster , physician assistant. Just this months she had a blood draw on the regular physical with him and was not told about any abnormalities.  We will refill the medication today.  Katie Douglas is a 34 year old AAF . With a history of seizures and vertigo. She returns today for follow-up. She states that she has remained on Keppra XR 500 mg twice a day. She states that she was able to locate a pharmacy with the same manufacturer that she had previously. She states that since then she's not had any additional seizures. Reports that her last seizure was April 7. Denies any changes with her gait or balance. She is able to complete all ADLs independently. She is not operating a motor vehicle. She reports that her vertigo episodes resolved with vestibular rehabilitation. She returns today for an evaluation.  HISTORY 10/18/16: Katie Douglas is a 34 y.o. female  Is seen here as a referral  from Dr. David Stall,   History _ The Patient  is a 34 year old right-handed African-American female that presented on November 22 to the [facility] emergency room. She has a history of seizure activity and has been treated with Keppra. When she presented to the emergency room for chief complaint was acute vertigo. During her emergency room visit a CT angiogram of head and neck was performed which showed no dissection there was no stroke and she was started on a benzodiazepine, meclizine and vestibular rehabilitation was consulted. Physical therapy however evaluated the patient and recommended no intervention as her symptoms had resolved by the time they came to see her. She was asked to follow up with [place] in neurology as an outpatient. She was also asked not to drive or operate machinery on her new 2 medications. Her seizure medications were refilled but not changed. She cannot drive until April 2956.  [name] Her last seizure was on October 7th of this year, prior to her vertigo presentation and apparently unrelated. Seizures begun 2 years ago, of unknown origin. usually appear to be tonic, she does not seem to convulse just to become very stiff and verbally unresponsive, she is also described as being postictally confused and combative. She is unsure about the duration of her seizures. She does not have them frequently. She suffered her first seizure, was evaluated at in [place] but not placed on medication as this had been a single event. 2 months later she had another spell and  now was placed on medication. She was worked up with an EEG, had an imaging study, but I do not have the reports available. A total of 4 seizures thus far.   The patient reports that she was changed to a generic form of Keppra, actually between to generics to a different form. She had previously in [place] filled at [facility] made by [employer], 500 mg, of which she took  1 tab twice a day, she now changde to right-aid and  the medication looks different but is described as levetiracetam 500 mg, she is concerned that this new generic has not had the same seizure protective value. She was able to operate machinery and felt good on her previous generic medication, the first day she took the new medication she was unable to coordinate as well was drowsy and felt impaired.  REVIEW OF SYSTEMS: Out of a complete 14 system review of symptoms, the patient complains only of the following symptoms, and all other reviewed systems are negative.  See history of present illness   Keppra- no side effect.   ALLERGIES: Allergies  Allergen Reactions  . Nuvaring [Etonogestrel-Ethinyl Estradiol]     Had odor with this    HOME MEDICATIONS: Outpatient Medications Prior to Visit  Medication Sig Dispense Refill  . levETIRAcetam (KEPPRA XR) 500 MG 24 hr tablet Take 2 tablets (1,000 mg total) by mouth daily. 60 tablet 0  . Norethindrone-Ethinyl Estradiol-Fe Biphas (LO LOESTRIN FE) 1 MG-10 MCG / 10 MCG tablet Take 1 tablet by mouth daily. 28 tablet 11   No facility-administered medications prior to visit.    PAST MEDICAL HISTORY: Past Medical History:  Diagnosis Date  . Contraception management    prefers OCP.  likes monthly period to come.   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 Pregnancy with history of miscarriage    related to Group B strep, twice  . Seizure (HCC) 2015   established with Dr. Vickey Huger 09/2016  . Wears contact lenses     PAST SURGICAL HISTORY: Past Surgical History:  Procedure Laterality Date  . CERVICAL CERCLAGE  2009  . CESAREAN SECTION N/A 2013    FAMILY HISTORY: Family History  Problem Relation Age of Onset  . Sarcoidosis Mother   . Diabetes Father   . Hypertension Father   . Cancer Paternal Grandmother   . Cancer Paternal Grandfather        prostate cancer  . Diabetes Paternal Grandfather   . Hypertension Paternal Grandfather     SOCIAL  HISTORY: Social History   Socioeconomic History  . Marital status: Single    Spouse name: Not on file  . Number of children: Not on file  . Years of education: Not on file  . Highest education Douglas: Not on file  Occupational History  . Not on file  Tobacco Use  . Smoking status: Former Smoker    Quit date: 04/25/2012    Years since quitting: 7.9  . Smokeless tobacco: Never Used  Substance and Sexual Activity  . Alcohol use: Yes    Alcohol/week: 3.0 standard drinks    Types: 3 Shots of liquor per week    Comment: socially  . Drug use: Yes    Frequency: 1.0 times per week    Types: Marijuana  . Sexual activity: Not on file  Other Topics Concern  . Not on file  Social History Narrative   Lives with her daughter, Katie Douglas.  Works at [employer] and [employer], Therapist, art.   Exercises.  Eats relatively healthy. Rest of family is in [place] and [place].  11/2018   Social Determinants of Health   Financial Resource Strain:   . Difficulty of Paying Living Expenses:   Food Insecurity:   . Worried About Charity fundraiser in the Last Year:   . Arboriculturist in the Last Year:   Transportation Needs:   . Film/video editor (Medical):   [name] Lack of Transportation (Non-Medical):   Physical Activity:   . Days of Exercise per Week:   . Minutes of Exercise per Session:   Stress:   . Feeling of Stress :   Social Connections:   . Frequency of Communication with Friends and Family:   . Frequency of Social Gatherings with Friends and Family:   . Attends Religious Services:   . Active Member of Clubs or Organizations:   . Attends Archivist Meetings:   [name] Marital Status:   Intimate Partner Violence:   . Fear of Current or Ex-Partner:   . Emotionally Abused:   [name] Physically Abused:   . Sexually Abused:       PHYSICAL EXAM  Vitals:   03/26/20 0859  BP: 137/85  Pulse: 60  Temp: (!) 97.4 F (36.3 C)  Weight: 171 lb (77.6 kg)  Height: 5\' 2"  (1.575 m)   Body mass  index is 31.28 kg/m. neck circumference 16",  mallampati 3. Retrognathia.   Generalized: Well developed, in no acute distress   Neurological examination  Mentation: Alert oriented to time, place, history taking. Follows all commands speech and language fluent Cranial nerve: no changes in smell and taste-no vision changes (contact wearer)   Pupils were equal round reactive to light. Extraocular movements were full, visual field were full on confrontational test. Facial sensation and strength were normal. Uvula tongue midline. Head turning and shoulder shrug  were normal and symmetric. Motor: The motor strength is equal and symmetric in all 4 extremities. Good symmetric motor tone is noted throughout.  Sensory: Sensory testing is intact to vibration, soft touch on all 4 extremities.  Coordination: Cerebellar testing reveals good finger-nose bilaterally.  Gait and station: Gait is normal. Tandem gait is intact Romberg is negative. No drift is seen.  Reflexes: Deep tendon reflexes are symmetric and normal bilaterally.   DIAGNOSTIC DATA (LABS, IMAGING, TESTING) - I reviewed patient records, labs, notes, testing and imaging myself where available.  Lab Results  Component Value Date   WBC 6.1 10/16/2018   HGB 13.3 10/16/2018   HCT 40.6 10/16/2018   MCV 84 10/16/2018   PLT 307 10/16/2018      Component Value Date/Time   NA 138 10/16/2018 1145   K 4.5 10/16/2018 1145   CL 104 10/16/2018 1145   CO2 23 10/16/2018 1145   GLUCOSE 83 10/16/2018 1145   GLUCOSE 129 (H) 10/12/2016 0526   BUN 7 10/16/2018 1145   CREATININE 0.78 10/16/2018 1145   CALCIUM 9.6 10/16/2018 1145   PROT 6.9 10/16/2018 1145   ALBUMIN 4.7 10/16/2018 1145   AST 16 10/16/2018 1145   ALT 16 10/16/2018 1145   ALKPHOS 58 10/16/2018 1145   BILITOT 0.4 10/16/2018 1145   GFRNONAA 100 10/16/2018 1145   GFRAA 116 10/16/2018 1145   Lab Results  Component Value Date   CHOL 153 12/03/2018   HDL 55 12/03/2018   LDLCALC 83  12/03/2018   TRIG 75 12/03/2018   CHOLHDL  2.8 12/03/2018   Lab Results  Component Value Date   HGBA1C 5.4 12/03/2018    Lab Results  Component Value Date   TSH 0.956 12/03/2018      ASSESSMENT AND PLAN 34 y.o. year old female of African- American ethnicity   has a past medical history of Contraception management, Pregnancy with history of miscarriage, Seizure (HCC) (2015), and Wears contact lenses. here with:  1. Seizures-  2. Vertigo resolved   She will continue on Keppra XR 500 mg twice a day. Advised that if she has any additional seizure event she she'll let us know.  She had resumed driving in April 2019 after 6 month of seizure freedom, now has been almost 22 month seizure free.  She will follow-up in 12 months or sooner if needed.    Melvyn Novas, MD  03/26/2020, 9:19 AM [facility] [address] [place], [place] [address] [phone]

## 2020-03-26 NOTE — Patient Instructions (Signed)
Levetiracetam extended-release tablets What is this medicine? LEVETIRACETAM (lee ve tye RA se tam) is an antiepileptic drug. It is used with other medicines to treat certain types of seizures. This medicine may be used for other purposes; ask your health care provider or pharmacist if you have questions. COMMON BRAND NAME(S): Keppra XR, Roweepra What should I tell my health care provider before I take this medicine? They need to know if you have any of these conditions:  kidney disease  suicidal thoughts, plans, or attempt; a previous suicide attempt by you or a family member  an unusual or allergic reaction to levetiracetam, other medicines, foods, dyes, or preservatives  pregnant or trying to get pregnant  breast-feeding How should I use this medicine? Take this medicine by mouth with a glass of water. Follow the directions on the prescription label. Do not cut, crush or chew this medicine. You may take this medicine with or without food. Take your doses at regular intervals. Do not take your medicine more often than directed. Do not stop taking this medicine or any of your seizure medicines unless instructed by your doctor or health care professional. Stopping your medicine suddenly can increase your seizures or their severity. A special MedGuide will be given to you by the pharmacist with each prescription and refill. Be sure to read this information carefully each time. Contact your pediatrician or health care professional regarding the use of this medication in children. While this drug may be prescribed for children as young as 12 years of age for selected conditions, precautions do apply. Overdosage: If you think you have taken too much of this medicine contact a poison control center or emergency room at once. NOTE: This medicine is only for you. Do not share this medicine with others. What if I miss a dose? If you miss a dose and it has only been a few hours, take it as soon as you  can. If it is almost time for your next dose, take only that dose. Do not take double or extra doses. What may interact with this medicine? This medicine may interact with the following medications:  carbamazepine  colesevelam  probenecid  sevelamer This list may not describe all possible interactions. Give your health care provider a list of all the medicines, herbs, non-prescription drugs, or dietary supplements you use. Also tell them if you smoke, drink alcohol, or use illegal drugs. Some items may interact with your medicine. What should I watch for while using this medicine? Visit your doctor or health care provider for a regular check on your progress. Wear a medical identification bracelet or chain to say you have epilepsy, and carry a card that lists all your medications. This medicine may cause serious skin reactions. They can happen weeks to months after starting the medicine. Contact your health care provider right away if you notice fevers or flu-like symptoms with a rash. The rash may be red or purple and then turn into blisters or peeling of the skin. Or, you might notice a red rash with swelling of the face, lips or lymph nodes in your neck or under your arms. It is important to take this medicine exactly as instructed by your health care provider. When first starting treatment, your dose may need to be adjusted. It may take weeks or months before your dose is stable. You should contact your doctor or health care provider if your seizures get worse or if you have any new types of seizures. You may   get drowsy or dizzy. Do not drive, use machinery, or do anything that needs mental alertness until you know how this medicine affects you. Do not stand or sit up quickly, especially if you are an older patient. This reduces the risk of dizzy or fainting spells. Alcohol may interfere with the effect of this medicine. Avoid alcoholic drinks. The use of this medicine may increase the chance of  suicidal thoughts or actions. Pay special attention to how you are responding while on this medicine. Any worsening of mood, or thoughts of suicide or dying should be reported to your health care provider right away. The tablet shell for some brands of this medicine does not dissolve. This is normal. The tablet shell may appear in the stool. This is not cause for concern. Women who become pregnant while using this medicine may enroll in the [place] American Antiepileptic Drug Pregnancy Registry by calling 1-888-233-2334. This registry collects information about the safety of antiepileptic drug use during pregnancy. What side effects may I notice from receiving this medicine? Side effects that you should report to your doctor or health care professional as soon as possible:  allergic reactions like skin rash, itching or hives, swelling of the face, lips, or tongue  breathing problems  changes in emotions or moods  dark urine  general ill feeling or flu-like symptoms  problems with balance, talking, walking  rash, fever, and swollen lymph nodes  redness, blistering, peeling or loosening of the skin, including inside the mouth  suicidal thoughts or actions  unusually weak or tired  yellowing of the eyes or skin Side effects that usually do not require medical attention (report to your doctor or health care professional if they continue or are bothersome):  diarrhea  dizzy, drowsy  headache  loss of appetite This list may not describe all possible side effects. Call your doctor for medical advice about side effects. You may report side effects to FDA at 1-800-FDA-1088. Where should I keep my medicine? Keep out of reach of children. Store at room temperature between 15 and 30 degrees C (59 and 86 degrees F). Throw away any unused medicine after the expiration date. NOTE: This sheet is a summary. It may not cover all possible information. If you have questions about this medicine,  talk to your doctor, pharmacist, or health care provider.  2020 Elsevier/Gold Standard (2019-02-08 15:14:16)  

## 2020-05-01 ENCOUNTER — Telehealth: Payer: Self-pay | Admitting: Neurology

## 2020-05-01 NOTE — Telephone Encounter (Signed)
[address] [place], [place] [address] [phone] Unknown Unknown / Unknown Unknown   Immunizations Administered Reconcile with Patient's Chart A [facility] health care provider has provided immunization services to the patient named above. The patient has identified you as their primary care provider. Please update the patient's chart to include the following immunization(s) listed below. In addition, immunization information will be reported to the state immunization registry where permitted.   Vaccine Administered Dose Injection Route / Site Date Administered Manufacturer Lot # Expiration Date Administering Provider  (208) COVID-19, mRNA, LNP-S, PF, 30 mcg/0.3 mL dose .30 ML IM / LD 04/21/2020 [employer] [id number] 07/21/2020   IMPORTANT WARNING: This message is intended for the use of the person or entity to whom it is addressed and may contain information that is privileged and confidential, the disclosure of which is governed by applicable law. If the reader of this message is not the intended recipient, or the employee or agent responsible for delivering it to the intended recipient, you are hereby notified that any dissemination, distribution or copying of this information is STRICTLY PROHIBITED. 'If you have received this message in error, please notify us immediately.'

## 2020-05-01 NOTE — Telephone Encounter (Signed)
Glad to hear she got the covid vaccine 04-23-2020.   TY

## 2020-11-06 ENCOUNTER — Other Ambulatory Visit: Payer: Self-pay | Admitting: Medical

## 2020-12-02 ENCOUNTER — Other Ambulatory Visit: Payer: BLUE CROSS/BLUE SHIELD

## 2020-12-25 ENCOUNTER — Ambulatory Visit (INDEPENDENT_AMBULATORY_CARE_PROVIDER_SITE_OTHER): Payer: BC Managed Care – PPO | Admitting: Medical

## 2020-12-25 ENCOUNTER — Encounter: Payer: Self-pay | Admitting: Medical

## 2020-12-25 ENCOUNTER — Other Ambulatory Visit: Payer: Self-pay

## 2020-12-25 ENCOUNTER — Other Ambulatory Visit (HOSPITAL_COMMUNITY)
Admission: RE | Admit: 2020-12-25 | Discharge: 2020-12-25 | Disposition: A | Discharge status: Home / Self Care | Payer: Medicaid Other | Source: Ambulatory Visit | Attending: Medical | Admitting: Medical

## 2020-12-25 VITALS — BP 138/84 | HR 71 | Ht 62.0 in | Wt 171.8 lb

## 2020-12-25 DIAGNOSIS — Z0000 Encounter for general adult medical examination without abnormal findings: Secondary | ICD-10-CM

## 2020-12-25 DIAGNOSIS — Z3041 Encounter for surveillance of contraceptive pills: Secondary | ICD-10-CM | POA: Diagnosis not present

## 2020-12-25 DIAGNOSIS — Z113 Encounter for screening for infections with a predominantly sexual mode of transmission: Secondary | ICD-10-CM | POA: Diagnosis not present

## 2020-12-25 DIAGNOSIS — Z124 Encounter for screening for malignant neoplasm of cervix: Secondary | ICD-10-CM | POA: Insufficient documentation

## 2020-12-25 DIAGNOSIS — G40909 Epilepsy, unspecified, not intractable, without status epilepticus: Secondary | ICD-10-CM | POA: Diagnosis not present

## 2020-12-25 DIAGNOSIS — Z1159 Encounter for screening for other viral diseases: Secondary | ICD-10-CM | POA: Insufficient documentation

## 2020-12-25 DIAGNOSIS — Z79899 Other long term (current) drug therapy: Secondary | ICD-10-CM

## 2020-12-25 DIAGNOSIS — Z131 Encounter for screening for diabetes mellitus: Secondary | ICD-10-CM

## 2020-12-25 LAB — POCT URINALYSIS DIP (PROADVANTAGE DEVICE)
Bilirubin, UA: NEGATIVE
Blood, UA: NEGATIVE
Glucose, UA: NEGATIVE mg/dL
Ketones, POC UA: NEGATIVE mg/dL
Leukocytes, UA: NEGATIVE
Nitrite, UA: NEGATIVE
Protein Ur, POC: NEGATIVE mg/dL
Specific Gravity, Urine: 1.015
Urobilinogen, Ur: 0.2
pH, UA: 6.5 (ref 5.0–8.0)

## 2020-12-25 LAB — POCT URINE PREGNANCY: Preg Test, Ur: NEGATIVE

## 2020-12-25 MED ORDER — LO LOESTRIN FE 1 MG-10 MCG / 10 MCG PO TABS: 1.0000 | ORAL_TABLET | Freq: Every day | ORAL | 11 refills | Status: AC

## 2020-12-25 NOTE — Progress Notes (Signed)
Subjective:   HPI  Katie Douglas is a 35 y.o. female who presents for Chief Complaint  Patient presents with  . Annual Exam    Physical with non fasting labs     Patient Care Team: Amma Crear, Kermit Balo, PA-C as PCP - General (Family Medicine) Sees dentist Sees eye doctor Dr. Melvyn Novas, neurology   Concerns: Doing well  Doing fine on current OCP, wants to continue this  Exercising, eating healthy  non fasting today   Reviewed their medical, surgical, family, social, medication, and allergy history and updated chart as appropriate.  Past Medical History:  Diagnosis Date  . Contraception management    prefers OCP.  likes monthly period to come.   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 Pregnancy with history of miscarriage    related to Group B strep, twice  . Seizure (HCC) 2015   established with Dr. Vickey Huger 09/2016  . Wears contact lenses     Family History  Problem Relation Age of Onset  . Sarcoidosis Mother   . Diabetes Father   . Hypertension Father   . Cancer Paternal Grandmother   . Cancer Paternal Grandfather        prostate cancer  . Diabetes Paternal Grandfather   . Hypertension Paternal Grandfather   . Heart disease Neg Hx   . Stroke Neg Hx      Current Outpatient Medications:  .  levETIRAcetam (KEPPRA XR) 500 MG 24 hr tablet, Take 2 tablets (1,000 mg total) by mouth daily., Disp: 60 tablet, Rfl: 11 .  Norethindrone-Ethinyl Estradiol-Fe Biphas (LO LOESTRIN FE) 1 MG-10 MCG / 10 MCG tablet, Take 1 tablet by mouth daily., Disp: 28 tablet, Rfl: 11  Allergies  Allergen Reactions  . Levetiracetam Other (See Comments)    Vertigo, generic blue ones gave her problems.  Tolerates other ones, white pills  . Nuvaring [Etonogestrel-Ethinyl Estradiol]     Had odor with this     Review of Systems Constitutional: -fever, -chills, -sweats, -unexpected weight change, -decreased appetite, -fatigue Allergy:  -sneezing, -itching, -congestion Dermatology: -changing moles, --rash, -lumps ENT: -runny nose, -ear pain, -sore throat, -hoarseness, -sinus pain, -teeth pain, - ringing in ears, -hearing loss, -nosebleeds Cardiology: -chest pain, -palpitations, -swelling, -difficulty breathing when lying flat, -waking up short of breath Respiratory: -cough, -shortness of breath, -difficulty breathing with exercise or exertion, -wheezing, -coughing up blood Gastroenterology: -abdominal pain, -nausea, -vomiting, -diarrhea, -constipation, -blood in stool, -changes in bowel movement, -difficulty swallowing or eating Hematology: -bleeding, -bruising  Musculoskeletal: -joint aches, -muscle aches, -joint swelling, -back pain, -neck pain, -cramping, -changes in gait Ophthalmology: denies vision changes, eye redness, itching, discharge Urology: -burning with urination, -difficulty urinating, -blood in urine, -urinary frequency, -urgency, -incontinence Neurology: -headache, -weakness, -tingling, -numbness, -memory loss, -falls, -dizziness Psychology: -depressed mood, -agitation, -sleep problems Breast/gyn: -breast tendnerss, -discharge, -lumps, -vaginal discharge,- irregular periods, -heavy periods     Objective:  BP 138/84   Pulse 71   Ht 5\' 2"  (1.575 m)   Wt 171 lb 12.8 oz (77.9 kg)   SpO2 98%   BMI 31.42 kg/m   General appearance: alert, no distress, WD/WN, African American female Skin: unremarkable HEENT: normocephalic, conjunctiva/corneas normal, sclerae anicteric, PERRLA, EOMi, nares patent, no discharge or erythema, pharynx normal Oral cavity: MMM, tongue normal, teeth normal Neck: supple, no lymphadenopathy, no thyromegaly, no masses, normal ROM, no bruits Chest: non tender, normal shape and expansion Heart: RRR, normal S1, S2, no murmurs Lungs:  CTA bilaterally, no wheezes, rhonchi, or rales Abdomen: +bs, soft, non tender, non distended, no masses, no hepatomegaly, no splenomegaly, no bruits Back: non  tender, normal ROM, no scoliosis Musculoskeletal: upper extremities non tender, no obvious deformity, normal ROM throughout, lower extremities non tender, no obvious deformity, normal ROM throughout Extremities: no edema, no cyanosis, no clubbing Pulses: 2+ symmetric, upper and lower extremities, normal cap refill Neurological: alert, oriented x 3, CN2-12 intact, strength normal upper extremities and lower extremities, sensation normal throughout, DTRs 2+ throughout, no cerebellar signs, gait normal Psychiatric: normal affect, behavior normal, pleasant  Breast: nontender, no masses or lumps, no skin changes, no nipple discharge or inversion, no axillary lymphadenopathy Gyn: Normal external genitalia without lesions, vagina with normal mucosa, cervix without lesions, no cervical motion tenderness, no abnormal vaginal discharge.  Uterus and adnexa not enlarged, nontender, no masses. Pap performed.  Exam chaperoned by nurse. Rectal: anus normal appearing    Assessment and Plan :   Encounter Diagnoses  Name Primary?  . Encounter for health maintenance examination in adult Yes  . Screening for cervical cancer   . Screen for STD (sexually transmitted disease)   . Seizure disorder (HCC)   . High risk medication use   . Encounter for surveillance of contraceptive pills   . Encounter for hepatitis C screening test for low risk patient   . Screening for diabetes mellitus     Today you had a preventative care visit or wellness visit.    Topics today may have included healthy lifestyle, diet, exercise, preventative care, vaccinations, sick and well care, proper use of emergency dept and after hours care, as well as other concerns.     Recommendations: Continue to return yearly for your annual wellness and preventative care visits.  This gives [place] a chance to discuss healthy lifestyle, exercise, vaccinations, review your chart record, and perform screenings where appropriate.  I recommend you see  your eye doctor yearly for routine vision care.  I recommend you see your dentist yearly for routine dental care including hygiene visits twice yearly.   Vaccination recommendations were reviewed You are up to date on covid booster and tetanus vaccine  Vaccinations updated as below:  Patient declines flu vaccines   Screening for cancer: Breast cancer screening: You should perform a self breast exam monthly.   We reviewed recommendations for regular mammograms and breast cancer screening.  Colon cancer screening:  Age 82  Cervical cancer screening: We reviewed recommendations for pap smear screening.  Skin cancer screening: Check your skin regularly for new changes, growing lesions, or other lesions of concern Come in for evaluation if you have skin lesions of concern.  Lung cancer screening: If you have a greater than 30 pack year history of tobacco use, then you qualify for lung cancer screening with a chest CT scan  We currently don't have screenings for other cancers besides breast, cervical, colon, and lung cancers.  If you have a strong family history of cancer or have other cancer screening concerns, please let me know.    Bone health: Get at least 150 minutes of aerobic exercise weekly Get weight bearing exercise at least once weekly   Heart health: Get at least 150 minutes of aerobic exercise weekly Limit alcohol It is important to maintain a healthy blood pressure and healthy cholesterol numbers    Separate significant issues discussed: Seizures - managed by neurology  Doing well on current contraception.  Discussed risk/benefits.     Selia was seen  today for annual exam.  Diagnoses and all orders for this visit:  Encounter for health maintenance examination in adult -     HIV Antibody (routine testing w rflx) -     RPR -     Hepatitis C antibody -     Hepatitis B surface antigen -     Comprehensive metabolic panel -     CBC -     Hemoglobin  A1c -     Cytology - PAP([place])  Screening for cervical cancer -     Cytology - PAP([place])  Screen for STD (sexually transmitted disease) -     HIV Antibody (routine testing w rflx) -     RPR -     Hepatitis C antibody -     Hepatitis B surface antigen  Seizure disorder (HCC)  High risk medication use  Encounter for surveillance of contraceptive pills -     POCT urine pregnancy -     POCT Urinalysis DIP (Pro[employer]age Device)  Encounter for hepatitis C screening test for low risk patient -     Hepatitis C antibody  Screening for diabetes mellitus -     Hemoglobin A1c  Other orders -     Norethindrone-Ethinyl Estradiol-Fe Biphas (LO LOESTRIN FE) 1 MG-10 MCG / 10 MCG tablet; Take 1 tablet by mouth daily.     Follow-up pending labs, yearly for physical

## 2020-12-26 LAB — COMPREHENSIVE METABOLIC PANEL
ALT: 19 IU/L (ref 0–32)
AST: 18 IU/L (ref 0–40)
Albumin/Globulin Ratio: 2 (ref 1.2–2.2)
Albumin: 4.6 g/dL (ref 3.8–4.8)
Alkaline Phosphatase: 55 IU/L (ref 44–121)
BUN/Creatinine Ratio: 9 (ref 9–23)
BUN: 8 mg/dL (ref 6–20)
Bilirubin Total: 0.4 mg/dL (ref 0.0–1.2)
CO2: 21 mmol/L (ref 20–29)
Calcium: 9.3 mg/dL (ref 8.7–10.2)
Chloride: 108 mmol/L — ABNORMAL HIGH (ref 96–106)
Creatinine, Ser: 0.91 mg/dL (ref 0.57–1.00)
GFR calc Af Amer: 95 mL/min/{1.73_m2} (ref >59)
GFR calc non Af Amer: 82 mL/min/{1.73_m2} (ref >59)
Globulin, Total: 2.3 g/dL (ref 1.5–4.5)
Glucose: 86 mg/dL (ref 65–99)
Potassium: 4 mmol/L (ref 3.5–5.2)
Sodium: 141 mmol/L (ref 134–144)
Total Protein: 6.9 g/dL (ref 6.0–8.5)

## 2020-12-26 LAB — HIV ANTIBODY (ROUTINE TESTING W REFLEX): HIV Screen 4th Generation wRfx: NONREACTIVE

## 2020-12-26 LAB — HEMOGLOBIN A1C
Est. average glucose Bld gHb Est-mCnc: 114 mg/dL
Hgb A1c MFr Bld: 5.6 % (ref 4.8–5.6)

## 2020-12-26 LAB — CBC
Hematocrit: 39.9 % (ref 34.0–46.6)
Hemoglobin: 13.5 g/dL (ref 11.1–15.9)
MCH: 29.3 pg (ref 26.6–33.0)
MCHC: 33.8 g/dL (ref 31.5–35.7)
MCV: 87 fL (ref 79–97)
Platelets: 272 10*3/uL (ref 150–450)
RBC: 4.6 x10E6/uL (ref 3.77–5.28)
RDW: 12 % (ref 11.7–15.4)
WBC: 6.5 10*3/uL (ref 3.4–10.8)

## 2020-12-26 LAB — HEPATITIS C ANTIBODY: Hep C Virus Ab: <0.1 s/co ratio (ref 0.0–0.9)

## 2020-12-26 LAB — RPR: RPR Ser Ql: NONREACTIVE

## 2020-12-26 LAB — HEPATITIS B SURFACE ANTIGEN: Hepatitis B Surface Ag: NEGATIVE

## 2020-12-29 ENCOUNTER — Encounter: Payer: Self-pay | Admitting: Neurology

## 2020-12-29 LAB — CYTOLOGY - PAP
Adequacy: ABSENT
Chlamydia: NEGATIVE
Comment: NEGATIVE
Comment: NEGATIVE
Comment: NORMAL
Diagnosis: NEGATIVE
High risk HPV: NEGATIVE
Neisseria Gonorrhea: NEGATIVE

## 2021-01-27 ENCOUNTER — Telehealth: Payer: Self-pay | Admitting: Neurology

## 2021-01-27 DIAGNOSIS — G40909 Epilepsy, unspecified, not intractable, without status epilepticus: Secondary | ICD-10-CM

## 2021-01-27 DIAGNOSIS — Z79899 Other long term (current) drug therapy: Secondary | ICD-10-CM

## 2021-01-27 NOTE — Addendum Note (Signed)
Addended by: Judi Cong on: 01/27/2021 03:31 PM   Modules accepted: Orders

## 2021-01-27 NOTE — Telephone Encounter (Signed)
Called the patient back.  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  Unsure how long the seizure lasted, she states that the whole event was most likely around 10 minutes because when she woke up and came to the paramedics were there.  Paramedics checked her out and appeared everything had stabilized at that point.  Patient did not feel the need to go to the hospital. Patient states the only thing that is different is she started taking melatonin.  She is on her cycle which she has not been on in 3 months but states this is not abnormal since she is on birth control.  Patient admits to smoking marijuana but states that she did not smoke that day.  She is unable to recall with certainty if she took her morning dose of her Keppra.  Currently she is on Keppra extended release 500 mg and she has been taking 1 tablet in the morning and 1 tablet in the evening.  Patient admits that the evening tablet will sometimes be forgotten about.  She states she did not have the evening dose the day before the event but also reiterated that this is not uncommon that she would miss that dose.  This is the first seizure that she has had in several years. Advised the patient I would review this with Dr. Vickey Huger and contact her with any recommendations she has.  Patient verbalized understanding and had no further questions at this time.

## 2021-01-27 NOTE — Telephone Encounter (Signed)
Called the patient to inform her that I spoke with Dr. Vickey Huger.  Advised that Dr. Vickey Huger would like for her to come in and have her Keppra level checked.  I have placed the order and advised the patient of the lab hours.  Patient verbalized understanding and will plan to come in on Thursday.  At this time patient encouraged to continue taking the medication as prescribed.  Advised that we will discuss if dosages need to be changed after we have the results of the lab.  Patient verbalized understanding and was appreciative for the call.

## 2021-01-27 NOTE — Telephone Encounter (Signed)
Needs Keppra level- she may have forgotten more doses than she can recall.  Lets get her on once -a- day Keppra to make that easier- 500 mg two at bedtime or in AM - as long as it can be remembered. CD

## 2021-01-27 NOTE — Telephone Encounter (Signed)
Pt asking for a call to discuss a seizure that she had last night, she did not go to the ED

## 2021-01-28 ENCOUNTER — Other Ambulatory Visit (INDEPENDENT_AMBULATORY_CARE_PROVIDER_SITE_OTHER): Payer: Self-pay

## 2021-01-28 DIAGNOSIS — G40909 Epilepsy, unspecified, not intractable, without status epilepticus: Secondary | ICD-10-CM

## 2021-01-28 DIAGNOSIS — Z0289 Encounter for other administrative examinations: Secondary | ICD-10-CM

## 2021-01-28 DIAGNOSIS — Z79899 Other long term (current) drug therapy: Secondary | ICD-10-CM

## 2021-01-30 LAB — LEVETIRACETAM LEVEL: Levetiracetam Lvl: 16.3 ug/mL (ref 10.0–40.0)

## 2021-01-30 NOTE — Progress Notes (Signed)
Keppra level in therapeutic range.

## 2021-02-01 ENCOUNTER — Other Ambulatory Visit: Payer: Self-pay | Admitting: Neurology

## 2021-02-01 ENCOUNTER — Telehealth: Payer: Self-pay | Admitting: Neurology

## 2021-02-01 NOTE — Telephone Encounter (Signed)
Called the patient to advise that the Keppra level was in the therapeutic range.  Patient verbalized understanding.  Patient advised to continue with the current dosage of Keppra that she is taking.  Advised that it is okay to take both pills first thing in the morning rather than splitting them up to help provide her with more compliance.  Patient has an upcoming appointment in May and will keep that appointment.  Advised that we could possibly discuss coming down to the extended release 750 mg dose to allow 1 pill in the morning at that visit.  For now patient was instructed to stay at the current level and we will follow-up at the next visit.  Instructed the patient to call with any questions or concerns.  Patient verbalized understanding and was appreciative for the call.

## 2021-02-01 NOTE — Telephone Encounter (Signed)
-----   Message from Melvyn Novas, MD sent at 01/30/2021 12:53 PM EST ----- Keppra level in therapeutic range.

## 2021-03-02 NOTE — Progress Notes (Signed)
Therapeutic level of medication confirmed.

## 2021-03-29 ENCOUNTER — Ambulatory Visit: Payer: Self-pay | Admitting: Neurology

## 2021-04-08 ENCOUNTER — Ambulatory Visit (INDEPENDENT_AMBULATORY_CARE_PROVIDER_SITE_OTHER): Payer: BC Managed Care – PPO | Admitting: Neurology

## 2021-04-08 ENCOUNTER — Encounter: Payer: Self-pay | Admitting: Neurology

## 2021-04-08 VITALS — BP 128/80 | HR 74 | Ht 62.5 in | Wt 171.0 lb

## 2021-04-08 DIAGNOSIS — G40909 Epilepsy, unspecified, not intractable, without status epilepticus: Secondary | ICD-10-CM

## 2021-04-08 MED ORDER — LEVETIRACETAM ER 500 MG PO TB24: 1000.0000 mg | ORAL_TABLET | Freq: Every day | ORAL | 3 refills | Status: AC

## 2021-04-08 NOTE — Progress Notes (Signed)
PATIENT: Katie Douglas DOB: May 28, 1986  REASON FOR VISIT: follow up- seizures, vertigo HISTORY FROM: patient  HISTORY OF PRESENT ILLNESS: 04-08-2021, RV for this established seizure patient, 35 year- old Katie Douglas, who plans to move back to [place] later this year- we discussed providing her with 90 day supplies of her epilepsy medication, and I wish her well with her future endeavors.      03-26-2020, RV  Patient of Crosby Oyster, [place] . Mrs. 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  Social history : Parents and grandparent were covid vaccinated, she is due Friday. She had no seizures in years- and remains on Keppra- highly compliant.  She has no room to wean off- she could theoretically be weaned off Keppra after 5 years of seizure freedom. refill today.    12-11-2018; Mrs. Katie Douglas is a meanwhile 35 year old patient who has been over 20 months seizure-free and was allowed to resume driving in April 4709.  She has been very compliant with her Keppra medication and it has an return worked very well for her.  Her primary care provider is Crosby Oyster , physician assistant. Just this months she had a blood draw on the regular physical with him and was not told about any abnormalities.  We will refill the medication today.  Katie Douglas is a 35 year old AAF . With a history of seizures and vertigo. She returns today for follow-up. She states that she has remained on Keppra XR 500 mg twice a day. She states that she was able to locate a pharmacy with the same manufacturer that she had previously. She states that since then she's not had any additional seizures. Reports that her last seizure was April 7. Denies any changes with her gait or balance. She is able to complete all ADLs independently. She is not  operating a motor vehicle. She reports that her vertigo episodes resolved with vestibular rehabilitation. She returns today for an evaluation.  HISTORY 10/18/16: Katie Douglas is a 35 y.o. female  Is seen here as a referral  from Dr. David Stall,   History _ The Patient  is a 35 year old right-handed African-American female that presented on November 22 to the [facility] emergency room. She has a history of seizure activity and has been treated with Keppra. When she presented to the emergency room for chief complaint was acute vertigo. During her emergency room visit a CT angiogram of head and neck was performed which showed no dissection there was no stroke and she was started on a benzodiazepine, meclizine and vestibular rehabilitation was consulted. Physical therapy however evaluated the patient and recommended no intervention as her symptoms had resolved by the time they came to see her. She was asked to follow up with [place] in neurology as an outpatient. She was also asked not to drive or operate machinery on her new 2 medications. Her seizure medications were refilled but not changed. She cannot drive until April 6283.  [name] Her last seizure was on October 7th of this year, prior to her vertigo presentation and apparently unrelated. Seizures begun 2 years ago, of unknown origin. usually appear to be tonic, she does not seem to convulse just to become very stiff and verbally unresponsive, she is also described as being postictally confused and combative.  She is unsure about the duration of her seizures. She does not have them frequently. She suffered her first seizure, was evaluated at in [place] but not placed on medication as this had been a single event. 2 months later she had another spell and now was placed on medication. She was worked up with an EEG, had an imaging study, but I do not have the reports available. A total of 4 seizures thus far.   The patient reports that she was changed  to a generic form of Keppra, actually between to generics to a different form. She had previously in [place] filled at [facility] made by [employer], 500 mg, of which she took  1 tab twice a day, she now changde to right-aid and the medication looks different but is described as levetiracetam 500 mg, she is concerned that this new generic has not had the same seizure protective value. She was able to operate machinery and felt good on her previous generic medication, the first day she took the new medication she was unable to coordinate as well was drowsy and felt impaired.  REVIEW OF SYSTEMS: Out of a complete 14 system review of symptoms, the patient complains only of the following symptoms, and all other reviewed systems are negative.  See history of present illness   Keppra- no side effect.   ALLERGIES: Allergies  Allergen Reactions  . Levetiracetam Other (See Comments)    Vertigo, generic blue ones gave her problems.  Tolerates other ones, white pills  . Nuvaring [Etonogestrel-Ethinyl Estradiol]     Had odor with this    HOME MEDICATIONS: Outpatient Medications Prior to Visit  Medication Sig Dispense Refill  . levETIRAcetam (KEPPRA XR) 500 MG 24 hr tablet Take 2 tablets (1,000 mg total) by mouth daily. 60 tablet 11  . Norethindrone-Ethinyl Estradiol-Fe Biphas (LO LOESTRIN FE) 1 MG-10 MCG / 10 MCG tablet Take 1 tablet by mouth daily. 28 tablet 11   No facility-administered medications prior to visit.    PAST MEDICAL HISTORY: Past Medical History:  Diagnosis Date  . Contraception management    prefers OCP.  likes monthly period to come.   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 Pregnancy with history of miscarriage    related to Group B strep, twice  . Seizure (HCC) 2015   established with Dr. Vickey Huger 09/2016  . Wears contact lenses     PAST SURGICAL HISTORY: Past Surgical History:  Procedure Laterality Date  . CERVICAL  CERCLAGE  2009  . CESAREAN SECTION N/A 2013    FAMILY HISTORY: Family History  Problem Relation Age of Onset  . Sarcoidosis Mother   . Diabetes Father   . Hypertension Father   . Cancer Paternal Grandmother   . Cancer Paternal Grandfather        prostate cancer  . Diabetes Paternal Grandfather   . Hypertension Paternal Grandfather   . Heart disease Neg Hx   . Stroke Neg Hx     SOCIAL HISTORY: Social History   Socioeconomic History  . Marital status: Single    Spouse name: Not on file  . Number of children: Not on file  . Years of education: Not on file  . Highest education level: Not on file  Occupational History  . Not on file  Tobacco Use  . Smoking status: Former Smoker    Quit date: 04/25/2012    Years since quitting: 8.9  . Smokeless  tobacco: Never Used  Substance and Sexual Activity  . Alcohol use: Yes    Comment: socially, occasionally  . Drug use: Yes    Frequency: 1.0 times per week    Types: Marijuana  . Sexual activity: Not on file  Other Topics Concern  . Not on file  Social History Narrative   Lives with her daughter.  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   Was working prior at [employer] and [employer], Clinical biochemistcustomer service.   [place] of [place] in [place].  11/2018   Social Determinants of Health   Financial Resource Strain: Not on file  Food Insecurity: Not on file  Transportation Needs: Not on file  Physical Activity: Not on file  Stress: Not on file  Social Connections: Not on file  Intimate Partner Violence: Not on file      PHYSICAL EXAM  Vitals:   04/08/21 0823  BP: 128/80  Pulse: 74  Weight: 171 lb (77.6 kg)  Height: 5' 2.5" (1.588 m)   Body mass index is 30.78 kg/m. neck circumference 16",  mallampati 3. Retrognathia.   Generalized: Well developed, in no acute distress   Neurological examination  Mentation: Alert oriented to time, place, history taking. Follows  all commands speech and language fluent Cranial nerve: no changes in smell and taste-no vision changes (contact wearer)   Pupils were equal round reactive to light. Extraocular movements were full, visual field were full on confrontational test. Facial sensation and strength were normal. Uvula tongue midline. Head turning and shoulder shrug  were normal and symmetric. Motor: The motor strength is equal and symmetric in all 4 extremities. Good symmetric motor tone is noted throughout.  Sensory: Sensory testing is intact to vibration, soft touch on all 4 extremities.  Coordination: Cerebellar testing reveals good finger-nose bilaterally.  Gait and station: Gait is normal. Tandem gait is intact Romberg is negative. No drift is seen.  Reflexes: Deep tendon reflexes are symmetric and normal bilaterally.   DIAGNOSTIC DATA (LABS, IMAGING, TESTING) - I reviewed patient records, labs, notes, testing and imaging myself where available.  Lab Results  Component Value Date   WBC 6.5 12/25/2020   HGB 13.5 12/25/2020   HCT 39.9 12/25/2020   MCV 87 12/25/2020   PLT 272 12/25/2020      Component Value Date/Time   NA 141 12/25/2020 1559   K 4.0 12/25/2020 1559   CL 108 (H) 12/25/2020 1559   CO2 21 12/25/2020 1559   GLUCOSE 86 12/25/2020 1559   GLUCOSE 129 (H) 10/12/2016 0526   BUN 8 12/25/2020 1559   CREATININE 0.91 12/25/2020 1559   CALCIUM 9.3 12/25/2020 1559   PROT 6.9 12/25/2020 1559   ALBUMIN 4.6 12/25/2020 1559   AST 18 12/25/2020 1559   ALT 19 12/25/2020 1559   ALKPHOS 55 12/25/2020 1559   BILITOT 0.4 12/25/2020 1559   GFRNONAA 82 12/25/2020 1559   GFRAA 95 12/25/2020 1559   Lab Results  Component Value Date   CHOL 153 12/03/2018   HDL 55 12/03/2018   LDLCALC 83 12/03/2018   TRIG 75 12/03/2018   CHOLHDL 2.8 12/03/2018   Lab Results  Component Value Date   HGBA1C 5.6 12/25/2020    Lab Results  Component Value Date   TSH 0.956 12/03/2018      ASSESSMENT AND PLAN 35  y.o. year old female of African- American ethnicity   has a past medical history of Contraception  management, Pregnancy with history of miscarriage, Seizure (HCC) (2015), and Wears contact lenses. here with:  1. Seizure disorder. Last recurrence in 1. March 22. After running out on Keppra, and she had taken half the doe to stretch it out.  She had intended to wean off after 5 years of seizure freedom , this step will now not be recommended.   2. Vertigo resolved   She will resume and continue on Keppra XR 500 mg twice a day.She had resumed driving in April 2019 after 6 month of seizure freedom, now has been almost 3 years seizure free, before this recurrence. She will able to drive again in [facility] 2022.   Melvyn Novas, MD  04/08/2021, 8:26 AM [facility] [address] [place], [place] [address] [phone]

## 2021-04-08 NOTE — Patient Instructions (Signed)
Levetiracetam extended-release tablets What is this medicine? LEVETIRACETAM (lee ve tye RA se tam) is an antiepileptic drug. It is used with other medicines to treat certain types of seizures. This medicine may be used for other purposes; ask your health care provider or pharmacist if you have questions. COMMON BRAND NAME(S): ELEPSIA XR, Keppra XR, Roweepra What should I tell my health care provider before I take this medicine? They need to know if you have any of these conditions:  kidney disease  suicidal thoughts, plans, or attempt; a previous suicide attempt by you or a family member  an unusual or allergic reaction to levetiracetam, other medicines, foods, dyes, or preservatives  pregnant or trying to get pregnant  breast-feeding How should I use this medicine? Take this medicine by mouth with a glass of water. Follow the directions on the prescription label. Do not cut, crush or chew this medicine. You may take this medicine with or without food. Take your doses at regular intervals. Do not take your medicine more often than directed. Do not stop taking this medicine or any of your seizure medicines unless instructed by your doctor or health care professional. Stopping your medicine suddenly can increase your seizures or their severity. A special MedGuide will be given to you by the pharmacist with each prescription and refill. Be sure to read this information carefully each time. Contact your pediatrician or health care professional regarding the use of this medication in children. While this drug may be prescribed for children as young as 99 years of age for selected conditions, precautions do apply. Overdosage: If you think you have taken too much of this medicine contact a poison control center or emergency room at once. NOTE: This medicine is only for you. Do not share this medicine with others. What if I miss a dose? If you miss a dose and it has only been a few hours, take it as  soon as you can. If it is almost time for your next dose, take only that dose. Do not take double or extra doses. What may interact with this medicine? This medicine may interact with the following medications:  carbamazepine  colesevelam  probenecid  sevelamer This list may not describe all possible interactions. Give your health care provider a list of all the medicines, herbs, non-prescription drugs, or dietary supplements you use. Also tell them if you smoke, drink alcohol, or use illegal drugs. Some items may interact with your medicine. What should I watch for while using this medicine? Visit your doctor or health care provider for a regular check on your progress. Wear a medical identification bracelet or chain to say you have epilepsy, and carry a card that lists all your medications. This medicine may cause serious skin reactions. They can happen weeks to months after starting the medicine. Contact your health care provider right away if you notice fevers or flu-like symptoms with a rash. The rash may be red or purple and then turn into blisters or peeling of the skin. Or, you might notice a red rash with swelling of the face, lips or lymph nodes in your neck or under your arms. It is important to take this medicine exactly as instructed by your health care provider. When first starting treatment, your dose may need to be adjusted. It may take weeks or months before your dose is stable. You should contact your doctor or health care provider if your seizures get worse or if you have any new types of seizures.  You may get drowsy or dizzy. Do not drive, use machinery, or do anything that needs mental alertness until you know how this medicine affects you. Do not stand or sit up quickly, especially if you are an older patient. This reduces the risk of dizzy or fainting spells. Alcohol may interfere with the effect of this medicine. Avoid alcoholic drinks. The use of this medicine may increase  the chance of suicidal thoughts or actions. Pay special attention to how you are responding while on this medicine. Any worsening of mood, or thoughts of suicide or dying should be reported to your health care provider right away. The tablet shell for some brands of this medicine does not dissolve. This is normal. The tablet shell may appear in the stool. This is not cause for concern. Women who become pregnant while using this medicine may enroll in the [place] [employer] Antiepileptic Drug Pregnancy Registry by calling [phone]. This registry collects information about the safety of antiepileptic drug use during pregnancy. What side effects may I notice from receiving this medicine? Side effects that you should report to your doctor or health care professional as soon as possible:  allergic reactions like skin rash, itching or hives, swelling of the face, lips, or tongue  breathing problems  changes in emotions or moods  dark urine  general ill feeling or flu-like symptoms  problems with balance, talking, walking  rash, fever, and swollen lymph nodes  redness, blistering, peeling or loosening of the skin, including inside the mouth  suicidal thoughts or actions  unusually weak or tired  yellowing of the eyes or skin Side effects that usually do not require medical attention (report to your doctor or health care professional if they continue or are bothersome):  diarrhea  dizzy, drowsy  headache  loss of appetite This list may not describe all possible side effects. Call your doctor for medical advice about side effects. You may report side effects to FDA at 1-800-FDA-1088. Where should I keep my medicine? Keep out of reach of children. Store at room temperature between 15 and 30 degrees C (59 and 86 degrees F). Throw away any unused medicine after the expiration date. NOTE: This sheet is a summary. It may not cover all possible information. If you have questions about this  medicine, talk to your doctor, pharmacist, or health care provider.  2021 [employer] Standard (2019-02-08 15:14:16)

## 2021-05-03 ENCOUNTER — Other Ambulatory Visit: Payer: Self-pay

## 2021-05-03 ENCOUNTER — Encounter: Payer: Self-pay | Admitting: Medical

## 2021-05-03 ENCOUNTER — Ambulatory Visit: Payer: BC Managed Care – PPO | Admitting: Medical

## 2021-05-03 VITALS — BP 152/84 | HR 65 | Ht 62.5 in | Wt 173.2 lb

## 2021-05-03 DIAGNOSIS — Z7251 High risk heterosexual behavior: Secondary | ICD-10-CM | POA: Diagnosis not present

## 2021-05-03 DIAGNOSIS — Z113 Encounter for screening for infections with a predominantly sexual mode of transmission: Secondary | ICD-10-CM | POA: Diagnosis not present

## 2021-05-03 DIAGNOSIS — N926 Irregular menstruation, unspecified: Secondary | ICD-10-CM

## 2021-05-03 LAB — POCT URINE PREGNANCY: Preg Test, Ur: NEGATIVE

## 2021-05-03 NOTE — Progress Notes (Signed)
Subjective:  Katie Douglas is a 35 y.o. female who presents for Chief Complaint  Patient presents with   std check     Here for concern for STD check. She recently broke up with boyfriend.   She is moving back to [place] to live with family support.  Her parents live in [place].   Currently she notes her partner does have other partners, but lied about them.   She notes that she does not have any other sexual partner.    Uses condoms but had unprotected sex 2 months ago.   She has no symptoms of concern.    She notes compliance with birth control.  LMP 2 months ago, March 7, but with her currently OCP sometimes skips a month.  No other aggravating or relieving factors.    No other c/o.  The following portions of the patient's history were reviewed and updated as appropriate: allergies, current medications, past family history, past medical history, past social history, past surgical history and problem list.  ROS Otherwise as in subjective above  Objective: BP (!) 152/84   Pulse 65   Ht 5' 2.5" (1.588 m)   Wt 173 lb 3.2 oz (78.6 kg)   SpO2 98%   BMI 31.17 kg/m   BP Readings from Last 3 Encounters:  05/03/21 (!) 152/84  04/08/21 128/80  12/25/20 138/84   General appearance: alert, no distress, well developed, well nourished    Assessment: Encounter Diagnoses  Name Primary?   Unprotected sex Yes   Screen for STD (sexually transmitted disease)    Missed period      Plan: We discussed symptoms and concerns.  Labs today.  We discussed condom use and preventative measures  I also recommended she have repeat testing in 3 months.   She has no concern for hepatitis exposure currently.  We just checked STD screen with hepatitis labs in February 2022  I wished her success on her moved to [place], [place]   Aalaiyah was seen today for std check.  Diagnoses and all orders for this visit:  Unprotected sex -     HIV Antibody (routine testing w rflx) -     RPR -      GC/Chlamydia Probe Amp  Screen for STD (sexually transmitted disease) -     HIV Antibody (routine testing w rflx) -     RPR -     GC/Chlamydia Probe Amp  Missed period -     POCT urine pregnancy   Follow up: pending labs

## 2021-05-04 LAB — RPR: RPR Ser Ql: NONREACTIVE

## 2021-05-04 LAB — HIV ANTIBODY (ROUTINE TESTING W REFLEX): HIV Screen 4th Generation wRfx: NONREACTIVE

## 2021-05-05 LAB — GC/CHLAMYDIA PROBE AMP
Chlamydia trachomatis, NAA: NEGATIVE
Neisseria Gonorrhoeae by PCR: NEGATIVE
